# Patient Record
Sex: Female | Born: 1955 | Race: Black or African American | Hispanic: No | Smoking: Never smoker
Health system: Southern US, Community
[De-identification: ages and names within clinical notes are randomized; demographics above are authoritative.]

## PROBLEM LIST (undated history)

## (undated) DIAGNOSIS — I10 Essential (primary) hypertension: Secondary | ICD-10-CM

## (undated) DIAGNOSIS — G5600 Carpal tunnel syndrome, unspecified upper limb: Secondary | ICD-10-CM

## (undated) DIAGNOSIS — M1990 Unspecified osteoarthritis, unspecified site: Secondary | ICD-10-CM

## (undated) DIAGNOSIS — R002 Palpitations: Secondary | ICD-10-CM

## (undated) HISTORY — PX: NEPHRECTOMY: SHX65

## (undated) HISTORY — DX: Unspecified osteoarthritis, unspecified site: M19.90

## (undated) HISTORY — PX: BACK SURGERY: SHX140

## (undated) HISTORY — DX: Carpal tunnel syndrome, unspecified upper limb: G56.00

## (undated) HISTORY — PX: HERNIA REPAIR: SHX51

---

## 2004-11-19 ENCOUNTER — Emergency Department: Payer: Self-pay | Admitting: Emergency Medicine

## 2005-10-30 ENCOUNTER — Emergency Department: Payer: Self-pay | Admitting: Emergency Medicine

## 2006-11-12 ENCOUNTER — Ambulatory Visit: Payer: Self-pay | Admitting: Internal Medicine

## 2006-11-20 ENCOUNTER — Inpatient Hospital Stay: Payer: Self-pay | Admitting: Internal Medicine

## 2006-11-29 ENCOUNTER — Other Ambulatory Visit: Payer: Self-pay

## 2006-11-29 ENCOUNTER — Ambulatory Visit: Payer: Self-pay | Admitting: Urology

## 2006-12-05 ENCOUNTER — Inpatient Hospital Stay: Payer: Self-pay | Admitting: Urology

## 2006-12-07 ENCOUNTER — Other Ambulatory Visit: Payer: Self-pay

## 2006-12-12 ENCOUNTER — Ambulatory Visit: Payer: Self-pay | Admitting: Internal Medicine

## 2006-12-18 ENCOUNTER — Ambulatory Visit: Payer: Self-pay | Admitting: Internal Medicine

## 2007-01-12 ENCOUNTER — Ambulatory Visit: Payer: Self-pay | Admitting: Internal Medicine

## 2009-11-19 ENCOUNTER — Emergency Department: Payer: Self-pay | Admitting: Emergency Medicine

## 2010-02-04 ENCOUNTER — Inpatient Hospital Stay: Payer: Self-pay | Admitting: Specialist

## 2010-11-28 ENCOUNTER — Inpatient Hospital Stay: Payer: Self-pay | Admitting: Surgery

## 2010-12-01 LAB — PATHOLOGY REPORT

## 2014-10-21 ENCOUNTER — Ambulatory Visit
Admission: RE | Admit: 2014-10-21 | Discharge: 2014-10-21 | Disposition: A | Discharge status: Home / Self Care | Payer: Self-pay | Source: Ambulatory Visit | Attending: Oncology | Admitting: Oncology

## 2014-10-21 ENCOUNTER — Other Ambulatory Visit: Payer: Self-pay | Admitting: Oncology

## 2014-10-21 ENCOUNTER — Ambulatory Visit: Payer: Self-pay | Attending: Oncology | Admitting: *Deleted

## 2014-10-21 VITALS — BP 144/90 | HR 75 | Temp 98.6°F | Resp 16 | Ht 63.0 in | Wt 195.2 lb

## 2014-10-21 DIAGNOSIS — Z0000 Encounter for general adult medical examination without abnormal findings: Secondary | ICD-10-CM

## 2014-10-21 NOTE — Patient Instructions (Signed)
Gave patient hand-out, Women Staying Healthy, Active and Well from [facility], with education on breast health, pap smears, heart and colon health. 

## 2014-10-21 NOTE — Progress Notes (Signed)
Subjective:     Patient ID: Kathleen Morgan, female   DOB: 01-07-1956, 59 y.o.   MRN: [id number]  HPI   Review of Systems     Objective:   Physical Exam  Pulmonary/Chest: Right breast exhibits no inverted nipple, no mass, no nipple discharge, no skin change and no tenderness. Left breast exhibits no inverted nipple, no mass, no nipple discharge, no skin change and no tenderness. Breasts are asymmetrical.    Abdominal: There is no splenomegaly or hepatomegaly.    Genitourinary: There is no rash, tenderness, lesion or injury on the right labia. There is no rash, tenderness, lesion or injury on the left labia. No erythema, tenderness or bleeding in the vagina. No foreign body around the vagina. No signs of injury around the vagina. Vaginal discharge found.         Assessment:     59 year old Black female presents to [facility] for clinical breast exam, pap smear and mammogram.  Clinical breast exam unremarkable.  Taught self breast awareness.  Specimen collected for pap smear.  Patient has been screened for eligibility.  She does not have any insurance, Medicare or Medicaid.  She also meets financial eligibility.  Hand-out given on the Affordable Care Act.    Plan:     Screening mammogram ordered.  Will follow-up per protocol.

## 2014-10-23 LAB — PAP LB AND HPV HIGH-RISK
HPV, high-risk: NEGATIVE
PAP SMEAR COMMENT: 0

## 2014-10-26 ENCOUNTER — Encounter: Payer: Self-pay | Admitting: *Deleted

## 2014-10-26 NOTE — Progress Notes (Signed)
Patient with normal mammogram and pap smear resutls.  Letter mailed to inform her of her results.  Patient is to follow-up in one year with annual screening mammogram and in 5 years for her pap smear.  [facility] to [place].

## 2016-02-15 ENCOUNTER — Emergency Department: Payer: No Typology Code available for payment source

## 2016-02-15 ENCOUNTER — Encounter: Payer: Self-pay | Admitting: Emergency Medicine

## 2016-02-15 ENCOUNTER — Emergency Department
Admission: EM | Admit: 2016-02-15 | Discharge: 2016-02-15 | Disposition: A | Discharge status: Home / Self Care | Payer: No Typology Code available for payment source | Attending: Emergency Medicine | Admitting: Emergency Medicine

## 2016-02-15 DIAGNOSIS — Y9389 Activity, other specified: Secondary | ICD-10-CM | POA: Insufficient documentation

## 2016-02-15 DIAGNOSIS — M7592 Shoulder lesion, unspecified, left shoulder: Secondary | ICD-10-CM | POA: Insufficient documentation

## 2016-02-15 DIAGNOSIS — Y92410 Unspecified street and highway as the place of occurrence of the external cause: Secondary | ICD-10-CM | POA: Insufficient documentation

## 2016-02-15 DIAGNOSIS — M19012 Primary osteoarthritis, left shoulder: Secondary | ICD-10-CM | POA: Diagnosis not present

## 2016-02-15 DIAGNOSIS — Y999 Unspecified external cause status: Secondary | ICD-10-CM | POA: Diagnosis not present

## 2016-02-15 DIAGNOSIS — M7582 Other shoulder lesions, left shoulder: Secondary | ICD-10-CM

## 2016-02-15 DIAGNOSIS — I10 Essential (primary) hypertension: Secondary | ICD-10-CM | POA: Diagnosis not present

## 2016-02-15 DIAGNOSIS — S4992XA Unspecified injury of left shoulder and upper arm, initial encounter: Secondary | ICD-10-CM | POA: Diagnosis present

## 2016-02-15 DIAGNOSIS — M778 Other enthesopathies, not elsewhere classified: Secondary | ICD-10-CM

## 2016-02-15 HISTORY — DX: Essential (primary) hypertension: I10

## 2016-02-15 HISTORY — DX: Palpitations: R00.2

## 2016-02-15 MED ORDER — HYDROCODONE-ACETAMINOPHEN 5-325 MG PO TABS
1.0000 | ORAL_TABLET | Freq: Once | ORAL | Status: AC
  Administered 2016-02-15: 1 via ORAL
  Filled 2016-02-15: qty 1

## 2016-02-15 MED ORDER — TRAMADOL HCL 50 MG PO TABS: 50.0000 mg | ORAL_TABLET | Freq: Two times a day (BID) | ORAL | 0 refills | Status: DC
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
## 2016-02-15 NOTE — ED Notes (Signed)

## 2016-02-15 NOTE — Discharge Instructions (Signed)
Take the prescription med as needed for pain relief. Take OTC Aleve or Ibuprofen for non-drowsy pain relief.

## 2016-02-15 NOTE — ED Notes (Signed)
Patient transported to X-ray 

## 2017-04-19 ENCOUNTER — Ambulatory Visit
Admission: RE | Admit: 2017-04-19 | Discharge: 2017-04-19 | Disposition: A | Discharge status: Home / Self Care | Payer: Self-pay | Source: Ambulatory Visit | Attending: Physician Assistant | Admitting: Physician Assistant

## 2017-04-19 ENCOUNTER — Other Ambulatory Visit: Payer: Self-pay | Admitting: Physician Assistant

## 2017-04-19 DIAGNOSIS — M79641 Pain in right hand: Secondary | ICD-10-CM

## 2017-04-19 DIAGNOSIS — M19041 Primary osteoarthritis, right hand: Secondary | ICD-10-CM | POA: Insufficient documentation

## 2018-02-03 ENCOUNTER — Emergency Department
Admission: EM | Admit: 2018-02-03 | Discharge: 2018-02-03 | Disposition: A | Discharge status: Home / Self Care | Payer: Self-pay | Attending: Emergency Medicine | Admitting: Emergency Medicine

## 2018-02-03 ENCOUNTER — Emergency Department: Payer: Self-pay

## 2018-02-03 ENCOUNTER — Encounter: Payer: Self-pay | Admitting: Emergency Medicine

## 2018-02-03 DIAGNOSIS — M5030 Other cervical disc degeneration, unspecified cervical region: Secondary | ICD-10-CM | POA: Insufficient documentation

## 2018-02-03 DIAGNOSIS — I10 Essential (primary) hypertension: Secondary | ICD-10-CM | POA: Insufficient documentation

## 2018-02-03 DIAGNOSIS — M542 Cervicalgia: Secondary | ICD-10-CM | POA: Insufficient documentation

## 2018-02-03 MED ORDER — KETOROLAC TROMETHAMINE 30 MG/ML IJ SOLN
30.0000 mg | Freq: Once | INTRAMUSCULAR | Status: AC
  Administered 2018-02-03: 30 mg via INTRAMUSCULAR
  Filled 2018-02-03: qty 1

## 2018-02-03 MED ORDER — HYDROCODONE-ACETAMINOPHEN 5-325 MG PO TABS: 1.0000 | ORAL_TABLET | Freq: Four times a day (QID) | ORAL | 0 refills | Status: AC | PRN

## 2018-02-03 MED ORDER — NAPROXEN 500 MG PO TABS: 500.0000 mg | ORAL_TABLET | Freq: Two times a day (BID) | ORAL | 0 refills | Status: AC

## 2018-02-03 MED ORDER — OXYCODONE-ACETAMINOPHEN 5-325 MG PO TABS
1.0000 | ORAL_TABLET | Freq: Once | ORAL | Status: AC
  Administered 2018-02-03: 1 via ORAL
  Filled 2018-02-03: qty 1

## 2018-02-03 NOTE — Discharge Instructions (Signed)
Follow-up with your primary care provider if any continued problems.  Continue taking your muscle relaxant that you have at home.  You may also use heat or ice to your neck as needed for discomfort.  Begin taking naproxen 500 mg twice daily with food.  Also for moderate to severe pain take Norco 1 every 6 hours as needed.  This medication could cause drowsiness and increase her risk for falling.  Do not drive or operate machinery while taking this medication.

## 2018-02-03 NOTE — ED Provider Notes (Signed)
[facility] Emergency Department Provider Note  ____________________________________________   None    (approximate)  I have reviewed the triage vital signs and the nursing notes.   HISTORY  Chief Complaint Spasms and Shoulder Pain   HPI Kathleen Morgan is a 62 y.o. female presents today with complaint of cervical pain and continued muscle spasms.  Patient states that she woke 4 days ago with pain in her neck.  She denies any injury.  She went to her PCP and was given a prescription for Zanaflex which she states has not helped any.  She denies any paresthesias into her extremities.  She states that she does not know of any past injury to her neck.  Currently she rates her pain as a 10/10.   Past Medical History:  Diagnosis Date  . Heart palpitations   . Hypertension     There are no active problems to display for this patient.   Past Surgical History:  Procedure Laterality Date  . BACK SURGERY    . CESAREAN SECTION    . HERNIA REPAIR    . NEPHRECTOMY      Prior to Admission medications   Medication Sig Start Date End Date Taking? Authorizing Provider  HYDROcodone-acetaminophen (NORCO/VICODIN) 5-325 MG tablet Take 1 tablet by mouth every 6 (six) hours as needed for moderate pain. 02/03/18 02/03/19  Tommi Rumps, PA-C  naproxen (NAPROSYN) 500 MG tablet Take 1 tablet (500 mg total) by mouth 2 (two) times daily with a meal. 02/03/18   Tommi Rumps, PA-C    Allergies Patient has no known allergies.  No family history on file.  Social History Social History   Tobacco Use  . Smoking status: Never Smoker  . Smokeless tobacco: Never Used  Substance Use Topics  . Alcohol use: No    Alcohol/week: 0.0 standard drinks  . Drug use: No    Review of Systems Constitutional: No fever/chills Eyes: No visual changes. ENT: No sore throat. Cardiovascular: Denies chest pain. Respiratory: Denies shortness of breath. Musculoskeletal: Positive  for cervical pain. Skin: Negative for rash. Neurological: Negative for headaches, focal weakness or numbness. ___________________________________________   PHYSICAL EXAM:  VITAL SIGNS: ED Triage Vitals  Enc Vitals Group     BP 02/03/18 0916 127/64     Pulse Rate 02/03/18 0916 72     Resp 02/03/18 0916 20     Temp 02/03/18 0916 97.8 F (36.6 C)     Temp Source 02/03/18 0916 Oral     SpO2 02/03/18 0916 95 %     Weight 02/03/18 0913 213 lb (96.6 kg)     Height 02/03/18 0913 5\' 4"  (1.626 m)     Head Circumference --      Peak Flow --      Pain Score 02/03/18 0913 10     Pain Loc --      Pain Edu? --      Excl. in GC? --    Constitutional: Alert and oriented. Well appearing and in no acute distress. Eyes: Conjunctivae are normal. PERRL. EOMI. Head: Atraumatic. Nose: No congestion/rhinnorhea. Neck: No stridor.  No gross deformities noted of the cervical spine however there is tenderness on palpation of the C 5 and 6 area.  There is moderate tenderness also on palpation of the right trapezius muscle.  Range of motion is restricted secondary to discomfort.  Cardiovascular: Normal rate, regular rhythm. Grossly normal heart sounds.  Good peripheral circulation. Respiratory: Normal respiratory effort.  No retractions. Lungs CTAB. Musculoskeletal: Tenderness on palpation of the right trapezius muscle as noted above.  No evidence of injury or discoloration.  Good muscle strength bilaterally. Neurologic:  Normal speech and language. No gross focal neurologic deficits are appreciated.  Skin:  Skin is warm, dry and intact. No rash noted. Psychiatric: Mood and affect are normal. Speech and behavior are normal.  ____________________________________________   LABS (all labs ordered are listed, but only abnormal results are displayed)  Labs Reviewed - No data to display RADIOLOGY  ED MD interpretation:   Cervical spine x-ray is negative for any acute changes but there is degenerative disc  disease noted at C6 and C7.  Official radiology report(s): Dg Cervical Spine 2-3 Views  Result Date: 02/03/2018 CLINICAL DATA:  LEFT side neck and shoulder pain since Thursday, painful to turn head to the LEFT EXAM: CERVICAL SPINE - 2-3 VIEW COMPARISON:  None FINDINGS: Slight reversal cervical lordosis question muscle spasm. Vertebral body and disc space heights maintained. Small anterior endplate spurs at A5-W0C6-C7. No fracture, subluxation, or bone destruction. Minimal lateral cervical flexion to the RIGHT. Lung apices clear. C1-C2 alignment normal. IMPRESSION: Question muscle spasm. Degenerative disc disease changes at C6-C7. Electronically Signed   By: Ulyses SouthwardMark  Boles M.D.   On: 02/03/2018 10:40    ____________________________________________   PROCEDURES  Procedure(s) performed: None  Procedures  Critical Care performed: No  ____________________________________________   INITIAL IMPRESSION / ASSESSMENT AND PLAN / ED COURSE  As part of my medical decision making, I reviewed the following data within the electronic MEDICAL RECORD NUMBER Notes from prior ED visits and [place] Controlled Substance Database  Patient presents to the ED with complaint of cervical pain with radiation into her right trapezius muscle.  She states she woke up with a crick in her neck 4 days ago which has not improved.  She saw her PCP and was placed on Zanaflex which is not helped.  She denies any injury and there are no paresthesias into her extremities.  Patient was given Percocet and Toradol 30 mg IM while waiting for x-ray.  X-ray results were read as degenerative disc disease cervical spine.  Patient was made aware.  She is to continue taking her Zanaflex and also begin taking naproxen 500 mg twice daily and Norco as needed for moderate to severe pain.  She is to follow-up with her PCP.  She is also encouraged to use ice or heat to her neck as needed for discomfort. ____________________________________________   FINAL  CLINICAL IMPRESSION(S) / ED DIAGNOSES  Final diagnoses:  Degenerative disc disease, cervical     ED Discharge Orders         Ordered    HYDROcodone-acetaminophen (NORCO/VICODIN) 5-325 MG tablet  Every 6 hours PRN     02/03/18 1101    naproxen (NAPROSYN) 500 MG tablet  2 times daily with meals     02/03/18 1101           Note:  This document was prepared using Dragon voice recognition software and may include unintentional dictation errors.    Tommi RumpsSummers, Correne Lalani L, PA-C 02/03/18 1155    Jeanmarie PlantMcShane, James A, MD 02/03/18 1434

## 2018-02-03 NOTE — ED Triage Notes (Signed)
Pt reports is having muscle spasms in her neck and shoulder, went to her MD last week and was given zanaflex but it is not getting any better. Pt reports she woke up Thursday morning with the pain, denies injuries.

## 2018-02-03 NOTE — ED Notes (Addendum)
Pt reports left side neck and shoulder pain, states sxs started on Thursday, was seen by PCP and prescribed Zanaflex which she states is not helping. Pt states it painful to turn head to left side. Pt also taking ibuprofen which she states is not helping with sxs.    [facility].

## 2019-05-27 ENCOUNTER — Ambulatory Visit
Admission: RE | Admit: 2019-05-27 | Discharge: 2019-05-27 | Disposition: A | Discharge status: Home / Self Care | Payer: Medicare Other | Source: Ambulatory Visit | Attending: Family Medicine | Admitting: Family Medicine

## 2019-05-27 ENCOUNTER — Other Ambulatory Visit: Payer: Self-pay | Admitting: Family Medicine

## 2019-05-27 DIAGNOSIS — M25561 Pain in right knee: Secondary | ICD-10-CM

## 2019-06-10 ENCOUNTER — Emergency Department
Admission: EM | Admit: 2019-06-10 | Discharge: 2019-06-10 | Disposition: A | Discharge status: Home / Self Care | Payer: Medicare Other | Attending: Emergency Medicine | Admitting: Emergency Medicine

## 2019-06-10 ENCOUNTER — Emergency Department: Payer: Medicare Other

## 2019-06-10 ENCOUNTER — Encounter: Payer: Self-pay | Admitting: Medical Oncology

## 2019-06-10 ENCOUNTER — Other Ambulatory Visit: Payer: Self-pay

## 2019-06-10 DIAGNOSIS — J36 Peritonsillar abscess: Secondary | ICD-10-CM | POA: Diagnosis not present

## 2019-06-10 DIAGNOSIS — Z20822 Contact with and (suspected) exposure to covid-19: Secondary | ICD-10-CM | POA: Diagnosis not present

## 2019-06-10 DIAGNOSIS — J039 Acute tonsillitis, unspecified: Secondary | ICD-10-CM

## 2019-06-10 DIAGNOSIS — I10 Essential (primary) hypertension: Secondary | ICD-10-CM | POA: Insufficient documentation

## 2019-06-10 DIAGNOSIS — R112 Nausea with vomiting, unspecified: Secondary | ICD-10-CM | POA: Diagnosis present

## 2019-06-10 LAB — COMPREHENSIVE METABOLIC PANEL
ALT: 106 U/L — ABNORMAL HIGH (ref 0–44)
AST: 98 U/L — ABNORMAL HIGH (ref 15–41)
Albumin: 3.6 g/dL (ref 3.5–5.0)
Alkaline Phosphatase: 123 U/L (ref 38–126)
Anion gap: 11 (ref 5–15)
BUN: 18 mg/dL (ref 8–23)
CO2: 26 mmol/L (ref 22–32)
Calcium: 9 mg/dL (ref 8.9–10.3)
Chloride: 95 mmol/L — ABNORMAL LOW (ref 98–111)
Creatinine, Ser: 1.23 mg/dL — ABNORMAL HIGH (ref 0.44–1.00)
GFR calc Af Amer: 53 mL/min — ABNORMAL LOW (ref >60)
GFR calc non Af Amer: 46 mL/min — ABNORMAL LOW (ref >60)
Glucose, Bld: 130 mg/dL — ABNORMAL HIGH (ref 70–99)
Potassium: 3.8 mmol/L (ref 3.5–5.1)
Sodium: 132 mmol/L — ABNORMAL LOW (ref 135–145)
Total Bilirubin: 4.4 mg/dL — ABNORMAL HIGH (ref 0.3–1.2)
Total Protein: 8.8 g/dL — ABNORMAL HIGH (ref 6.5–8.1)

## 2019-06-10 LAB — URINALYSIS, COMPLETE (UACMP) WITH MICROSCOPIC: Specific Gravity, Urine: 1.032 — ABNORMAL HIGH (ref 1.005–1.030)

## 2019-06-10 LAB — CBC
HCT: 38.3 % (ref 36.0–46.0)
Hemoglobin: 12.7 g/dL (ref 12.0–15.0)
MCH: 30 pg (ref 26.0–34.0)
MCHC: 33.2 g/dL (ref 30.0–36.0)
MCV: 90.3 fL (ref 80.0–100.0)
Platelets: 254 10*3/uL (ref 150–400)
RBC: 4.24 MIL/uL (ref 3.87–5.11)
RDW: 13.5 % (ref 11.5–15.5)
WBC: 24.3 10*3/uL — ABNORMAL HIGH (ref 4.0–10.5)
nRBC: 0 % (ref 0.0–0.2)

## 2019-06-10 LAB — GROUP A STREP BY PCR: Group A Strep by PCR: DETECTED — AB

## 2019-06-10 LAB — POC SARS CORONAVIRUS 2 AG: SARS Coronavirus 2 Ag: NEGATIVE

## 2019-06-10 LAB — LIPASE, BLOOD: Lipase: 385 U/L — ABNORMAL HIGH (ref 11–51)

## 2019-06-10 MED ORDER — DEXAMETHASONE SODIUM PHOSPHATE 10 MG/ML IJ SOLN
10.0000 mg | Freq: Once | INTRAMUSCULAR | Status: AC
  Administered 2019-06-10: 10 mg via INTRAVENOUS
  Filled 2019-06-10: qty 1

## 2019-06-10 MED ORDER — PREDNISONE 10 MG PO TABS: 10.0000 mg | ORAL_TABLET | Freq: Every day | ORAL | 0 refills | Status: DC

## 2019-06-10 MED ORDER — IOHEXOL 300 MG/ML  SOLN
75.0000 mL | Freq: Once | INTRAMUSCULAR | Status: AC | PRN
  Administered 2019-06-10: 75 mL via INTRAVENOUS

## 2019-06-10 MED ORDER — SODIUM CHLORIDE 0.9 % IV SOLN
3.0000 g | Freq: Once | INTRAVENOUS | Status: AC
  Administered 2019-06-10: 3 g via INTRAVENOUS
  Filled 2019-06-10: qty 8

## 2019-06-10 MED ORDER — AMOXICILLIN-POT CLAVULANATE 875-125 MG PO TABS: 1.0000 | ORAL_TABLET | Freq: Two times a day (BID) | ORAL | 0 refills | Status: AC

## 2019-06-10 NOTE — ED Notes (Signed)
Pt reports that she started with N/V Friday night and since has been unable to keep anything down - pt reports that after vomiting x2 days her stomach began to hurt and her throat became sore - Pt has vomited x2 in 24 hours

## 2019-06-10 NOTE — Discharge Instructions (Signed)
Please take antibiotics and steroids as prescribed.  You may use over-the-counter Chloraseptic spray or lozenges for symptom relief as well.  You may also take Tylenol as needed for discomfort as written on the box.  Please follow-up with ENT in 2 days for recheck/reevaluation.  Call at the number provided.  Return to the emergency department for any increased pain, sensation of increased swelling such as difficulty swallowing or difficulty breathing, or any other symptom personally concerning to yourself.

## 2019-06-10 NOTE — ED Notes (Signed)
Pt PO challenged with water per Dr Lenard Lance request - pt was able to drink cup of cold water and even though swallowing was painful she was able to drink and stated that it "feels amazing" - Dr Lenard Lance notified

## 2019-06-10 NOTE — ED Provider Notes (Addendum)
[facility] Emergency Department Provider Note  Time seen: 10:35 AM  I have reviewed the triage vital signs and the nursing notes.   HISTORY  Chief Complaint Emesis and Abdominal Pain  HPI Kathleen Morgan is a 64 y.o. female with a past medical history of hypertension, presents to the emergency department for sore throat nausea and upper abdominal discomfort.  According to the patient she states she slept with a fan on Friday night, since that time she has had a very sore throat to the point where she is having difficulty swallowing due to the pain.  States it hurts mostly on the left side.  Patient also states nausea but denies any vomiting.  States mild upper abdominal/epigastric pain.  No fever.  No cough shortness of breath.  No diarrhea or constipation.   Past Medical History:  Diagnosis Date  . Heart palpitations   . Hypertension     There are no problems to display for this patient.   Past Surgical History:  Procedure Laterality Date  . BACK SURGERY    . CESAREAN SECTION    . HERNIA REPAIR    . NEPHRECTOMY      Prior to Admission medications   Medication Sig Start Date End Date Taking? Authorizing Provider  naproxen (NAPROSYN) 500 MG tablet Take 1 tablet (500 mg total) by mouth 2 (two) times daily with a meal. 02/03/18   Bridget Hartshorn L, PA-C    No Known Allergies  No family history on file.  Social History Social History   Tobacco Use  . Smoking status: Never Smoker  . Smokeless tobacco: Never Used  Substance Use Topics  . Alcohol use: No    Alcohol/week: 0.0 standard drinks  . Drug use: No    Review of Systems Constitutional: Negative for fever ENT: Positive for sore throat left greater than right. Cardiovascular: Negative for chest pain. Respiratory: Negative for shortness of breath. Gastrointestinal: Mild epigastric pain.  Positive nausea.  Negative for vomiting diarrhea. Genitourinary: Negative for urinary  compaints Musculoskeletal: Negative for musculoskeletal complaints Neurological: Negative for headache All other ROS negative  ____________________________________________   PHYSICAL EXAM:  VITAL SIGNS: ED Triage Vitals  Enc Vitals Group     BP 06/10/19 1011 (!) 141/81     Pulse Rate 06/10/19 1011 97     Resp 06/10/19 1011 18     Temp 06/10/19 1011 99 F (37.2 C)     Temp Source 06/10/19 1011 Oral     SpO2 06/10/19 1011 97 %     Weight 06/10/19 1012 211 lb 10.3 oz (96 kg)     Height 06/10/19 1012 5\' 4"  (1.626 m)     Head Circumference --      Peak Flow --      Pain Score 06/10/19 1012 10     Pain Loc --      Pain Edu? --      Excl. in GC? --    Constitutional: Alert and oriented. Well appearing and in no distress. Eyes: Normal exam ENT      Head: Normocephalic and atraumatic.      Mouth/Throat: Mucous membranes are moist.  No obvious pharyngeal erythema although cannot see the entire pharynx due to patient cooperation.  Moderate left-sided anterior cervical lymphadenopathy with tenderness to this area. Cardiovascular: Normal rate, regular rhythm.  Respiratory: Normal respiratory effort without tachypnea nor retractions. Breath sounds are clear Gastrointestinal: Soft and nontender. No distention.  Musculoskeletal: Nontender with normal range of  motion in all extremities.  Neurologic:  Normal speech and language. No gross focal neurologic deficits Skin:  Skin is warm, dry and intact.  Psychiatric: Mood and affect are normal.  ____________________________________________   RADIOLOGY  CT scan shows tonsillitis with a 16 x 23 x 14 mm PTA.  ____________________________________________   INITIAL IMPRESSION / ASSESSMENT AND PLAN / ED COURSE  Pertinent labs & imaging results that were available during my care of the patient were reviewed by me and considered in my medical decision making (see chart for details).   Patient presents emergency department for a sore throat  along with nausea, pain with swallowing and epigastric discomfort.  I tried multiple times to see the patient's tonsils/posterior pharynx, patient is unwilling to allow any evaluation with the tongue depressor.  Attempted tongue depressor at the anterior tongue the patient began gagging.  Insists that she cannot do it with a tongue depressor.  Attempted multiple times without the tongue depressor however I am not able to visualize the tonsils.  Patient does have moderate left-sided anterior cervical lymphadenopathy.  We will attempt to obtain a strep swab as well as a rapid Covid swab.  We will check basic labs.  Patient has slight epigastric tenderness but otherwise a benign abdomen.  Given the patient's significant left sore throat with left-sided cervical lymphadenopathy we will likely require CT imaging to rule out PTA given our inability to see the patient's tonsils/posterior pharynx.  16 x 23 x 14 left-sided PTA.  Spoke to Dr. Kathyrn Sheriff, who recommends starting the patient on IV Unasyn and IV Decadron.  States as long as the patient is able to swallow she can be discharged home on Augmentin and a steroid taper and he will follow her up in the office.  I discussed this with the patient who is agreeable to this plan of care.  Discussed return precautions for any trouble breathing at any point, or inability to swallow secretions, etc.  Patient's lab work does show significant leukocytosis of 24,000.  Strep swab is positive.  Covid test is negative.  Patient is able to drink water without issues.  States she is feeling better.  We will discharge on prednisone, Augmentin and have the patient follow-up with ENT in several days for recheck/reevaluation.  Discussed return precautions.  Kathleen Morgan was evaluated in Emergency Department on 06/10/2019 for the symptoms described in the history of present illness. She was evaluated in the context of the global COVID-19 pandemic, which necessitated consideration that  the patient might be at risk for infection with the SARS-CoV-2 virus that causes COVID-19. Institutional protocols and algorithms that pertain to the evaluation of patients at risk for COVID-19 are in a state of rapid change based on information released by regulatory bodies including the CDC and federal and state organizations. These policies and algorithms were followed during the patient's care in the ED.  ____________________________________________   FINAL CLINICAL IMPRESSION(S) / ED DIAGNOSES  Tonsillitis Peritonsillar abscess   Harvest Dark, MD 06/10/19 1236    Harvest Dark, MD 06/10/19 1414

## 2019-06-10 NOTE — ED Triage Notes (Signed)
Pt reports NV and sore throat that began Saturday. Pt denies fever. Reports generalized abd pain too.

## 2019-06-21 ENCOUNTER — Other Ambulatory Visit: Payer: Self-pay

## 2019-06-21 ENCOUNTER — Encounter: Payer: Self-pay | Admitting: Emergency Medicine

## 2019-06-21 ENCOUNTER — Emergency Department
Admission: EM | Admit: 2019-06-21 | Discharge: 2019-06-21 | Disposition: A | Discharge status: Home / Self Care | Payer: Medicare Other | Attending: Emergency Medicine | Admitting: Emergency Medicine

## 2019-06-21 DIAGNOSIS — H10023 Other mucopurulent conjunctivitis, bilateral: Secondary | ICD-10-CM | POA: Diagnosis not present

## 2019-06-21 DIAGNOSIS — H5713 Ocular pain, bilateral: Secondary | ICD-10-CM | POA: Diagnosis present

## 2019-06-21 MED ORDER — FLUORESCEIN SODIUM 1 MG OP STRP
1.0000 | ORAL_STRIP | Freq: Once | OPHTHALMIC | Status: AC
  Administered 2019-06-21: 16:00:00 1 via OPHTHALMIC
  Filled 2019-06-21: qty 1

## 2019-06-21 MED ORDER — EYE WASH OPHTH SOLN
1.0000 [drp] | OPHTHALMIC | Status: DC | PRN
  Filled 2019-06-21 (×2): qty 118

## 2019-06-21 MED ORDER — SULFACETAMIDE SODIUM 10 % OP SOLN: 2.0000 [drp] | Freq: Four times a day (QID) | OPHTHALMIC | 0 refills | Status: AC

## 2019-06-21 MED ORDER — OLOPATADINE HCL 0.1 % OP SOLN: 1.0000 [drp] | Freq: Two times a day (BID) | OPHTHALMIC | 1 refills | Status: AC

## 2019-06-21 MED ORDER — TETRACAINE HCL 0.5 % OP SOLN
2.0000 [drp] | Freq: Once | OPHTHALMIC | Status: AC
  Administered 2019-06-21: 16:00:00 2 [drp] via OPHTHALMIC
  Filled 2019-06-21: qty 4

## 2019-06-21 NOTE — ED Triage Notes (Signed)
FIRST NURSE NOTE:  Reports redness to left eye since Tuesday, worse today, reports feeling like sand in eye and gritty feeling in eye.

## 2019-06-21 NOTE — ED Provider Notes (Signed)
[facility] Emergency Department Provider Note   ____________________________________________   First MD Initiated Contact with Patient 06/21/19 1445     (approximate)  I have reviewed the triage vital signs and the nursing notes.   HISTORY  Chief Complaint Eye Pain    HPI Kathleen Morgan is a 64 y.o. female patient presents with bilateral eye pain and redness, left greater than right.  Patient states eyes feel "gritty".  Patient denies provocative incident for complaint.  Patient states several years since her last eye exam.  Patient do not use corrective lenses except for over-the-counter reading glasses.         Past Medical History:  Diagnosis Date  . Heart palpitations   . Hypertension     There are no problems to display for this patient.   Past Surgical History:  Procedure Laterality Date  . BACK SURGERY    . CESAREAN SECTION    . HERNIA REPAIR    . NEPHRECTOMY      Prior to Admission medications   Medication Sig Start Date End Date Taking? Authorizing Provider  naproxen (NAPROSYN) 500 MG tablet Take 1 tablet (500 mg total) by mouth 2 (two) times daily with a meal. 02/03/18   Letitia Neri L, PA-C  olopatadine (PATANOL) 0.1 % ophthalmic solution Place 1 drop into both eyes 2 (two) times daily. 06/21/19 06/20/20  Sable Feil, PA-C  predniSONE (DELTASONE) 10 MG tablet Take 1 tablet (10 mg total) by mouth daily. Day 1-3: take 4 tablets PO daily Day 4-6: take 3 tablets PO daily Day 7-9: take 2 tablets PO daily Day 10-12: take 1 tablet PO daily 06/10/19   Harvest Dark, MD  sulfacetamide (BLEPH-10) 10 % ophthalmic solution Place 2 drops into both eyes 4 (four) times daily for 10 days. 06/21/19 07/01/19  Sable Feil, PA-C    Allergies Patient has no known allergies.  History reviewed. No pertinent family history.  Social History Social History   Tobacco Use  . Smoking status: Never Smoker  . Smokeless tobacco: Never  Used  Substance Use Topics  . Alcohol use: No    Alcohol/week: 0.0 standard drinks  . Drug use: No    Review of Systems Constitutional: No fever/chills Eyes: Bilateral eye pain and drainage. ENT: No sore throat. Cardiovascular: Denies chest pain. Respiratory: Denies shortness of breath. Gastrointestinal: No abdominal pain.  No nausea, no vomiting.  No diarrhea.  No constipation. Genitourinary: Negative for dysuria. Musculoskeletal: Negative for back pain. Skin: Negative for rash. Neurological: Negative for headaches, focal weakness or numbness. Endocrine:  Hypertension   ____________________________________________   PHYSICAL EXAM:  VITAL SIGNS: ED Triage Vitals  Enc Vitals Group     BP 06/21/19 1413 (!) 141/83     Pulse Rate 06/21/19 1413 98     Resp 06/21/19 1413 20     Temp 06/21/19 1413 98 F (36.7 C)     Temp Source 06/21/19 1413 Oral     SpO2 06/21/19 1413 95 %     Weight 06/21/19 1410 218 lb (98.9 kg)     Height 06/21/19 1410 5\' 4"  (1.626 m)     Head Circumference --      Peak Flow --      Pain Score 06/21/19 1410 8     Pain Loc --      Pain Edu? --      Excl. in [place]? --    Constitutional: Alert and oriented. Well appearing and in no  acute distress. Eyes: See nurses note visual acuity.  Conjunctivae are erythematous.  PERRL. EOMI. dry yellow-green discharge left eye.   Cardiovascular: Normal rate, regular rhythm. Grossly normal heart sounds.  Good peripheral circulation. Respiratory: Normal respiratory effort.  No retractions. Lungs CTAB. Skin:  Skin is warm, dry and intact. No rash noted. Psychiatric: Mood and affect are normal. Speech and behavior are normal.  ____________________________________________   LABS (all labs ordered are listed, but only abnormal results are displayed)  Labs Reviewed - No data to display ____________________________________________  EKG   ____________________________________________  RADIOLOGY  ED MD  interpretation:    Official radiology report(s): No results found.  ____________________________________________   PROCEDURES  Procedure(s) performed (including Critical Care):  Procedures   ____________________________________________   INITIAL IMPRESSION / ASSESSMENT AND PLAN / ED COURSE  As part of my medical decision making, I reviewed the following data within the electronic MEDICAL RECORD NUMBER     Patient presents with 4 days of back pain with a yellow-greenish discharge.  Physical exam is consistent with conjunctivitis.  Patient given discharge care instruction advised use eyedrops as directed.  Patient also advised to follow-up with optometry.    Kathleen Morgan was evaluated in Emergency Department on 06/21/2019 for the symptoms described in the history of present illness. She was evaluated in the context of the global COVID-19 pandemic, which necessitated consideration that the patient might be at risk for infection with the SARS-CoV-2 virus that causes COVID-19. Institutional protocols and algorithms that pertain to the evaluation of patients at risk for COVID-19 are in a state of rapid change based on information released by regulatory bodies including the CDC and federal and state organizations. These policies and algorithms were followed during the patient's care in the ED.       ____________________________________________   FINAL CLINICAL IMPRESSION(S) / ED DIAGNOSES  Final diagnoses:  Other mucopurulent conjunctivitis of both eyes     ED Discharge Orders         Ordered    sulfacetamide (BLEPH-10) 10 % ophthalmic solution  4 times daily     06/21/19 1538    olopatadine (PATANOL) 0.1 % ophthalmic solution  2 times daily     06/21/19 1538           Note:  This document was prepared using Dragon voice recognition software and may include unintentional dictation errors.    Joni Reining, PA-C 06/21/19 1542    Sharyn Creamer, MD 06/21/19 [phone]

## 2019-06-21 NOTE — ED Triage Notes (Signed)
See first Rn Note, pt reports symptoms since Tuesday. Redness noted to L eye upon arrival to triage.

## 2019-06-21 NOTE — Discharge Instructions (Signed)
Follow discharge care instruction use eyedrops as directed.  Advised to follow-up with ophthalmology for definitive eye exam.

## 2019-08-15 ENCOUNTER — Other Ambulatory Visit: Payer: Self-pay | Admitting: Physician Assistant

## 2019-08-15 DIAGNOSIS — Z13820 Encounter for screening for osteoporosis: Secondary | ICD-10-CM

## 2019-08-15 DIAGNOSIS — Z1231 Encounter for screening mammogram for malignant neoplasm of breast: Secondary | ICD-10-CM

## 2020-12-24 IMAGING — CR DG KNEE COMPLETE 4+V*R*
1 series · 4 of 4 positions shown · non-contrast
Comparison: None.

CLINICAL DATA: Acute right knee pain.

EXAM:
RIGHT KNEE - COMPLETE 4+ VIEW

[Series 1: dg knee complete 4 views right · 0.14mm/px · 4 of 4 slices shown]
[im 1/4]
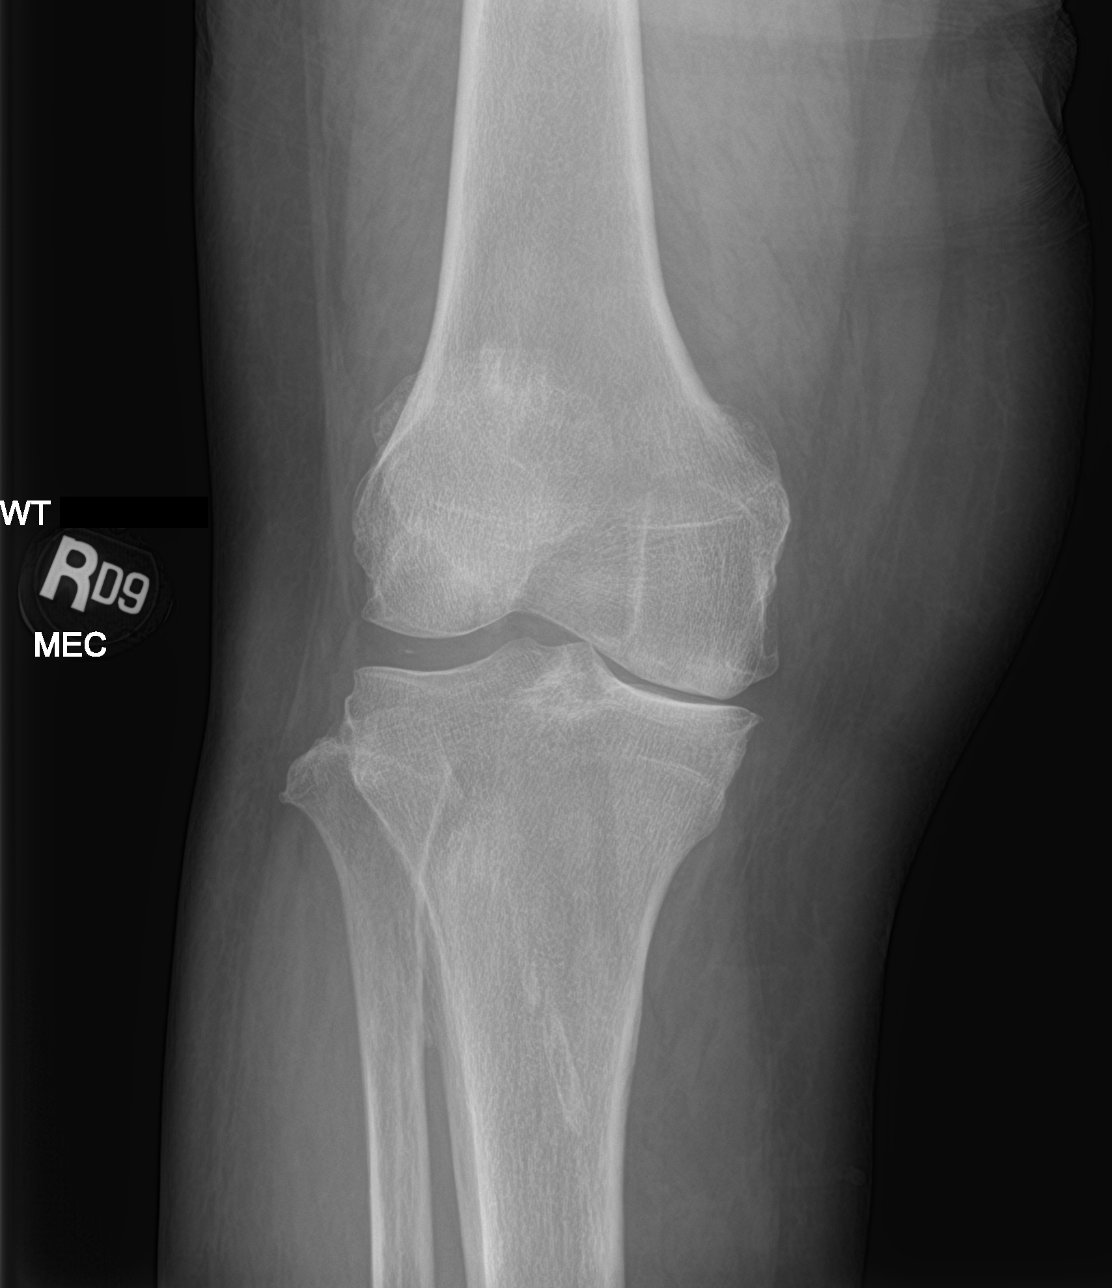
[im 2/4]
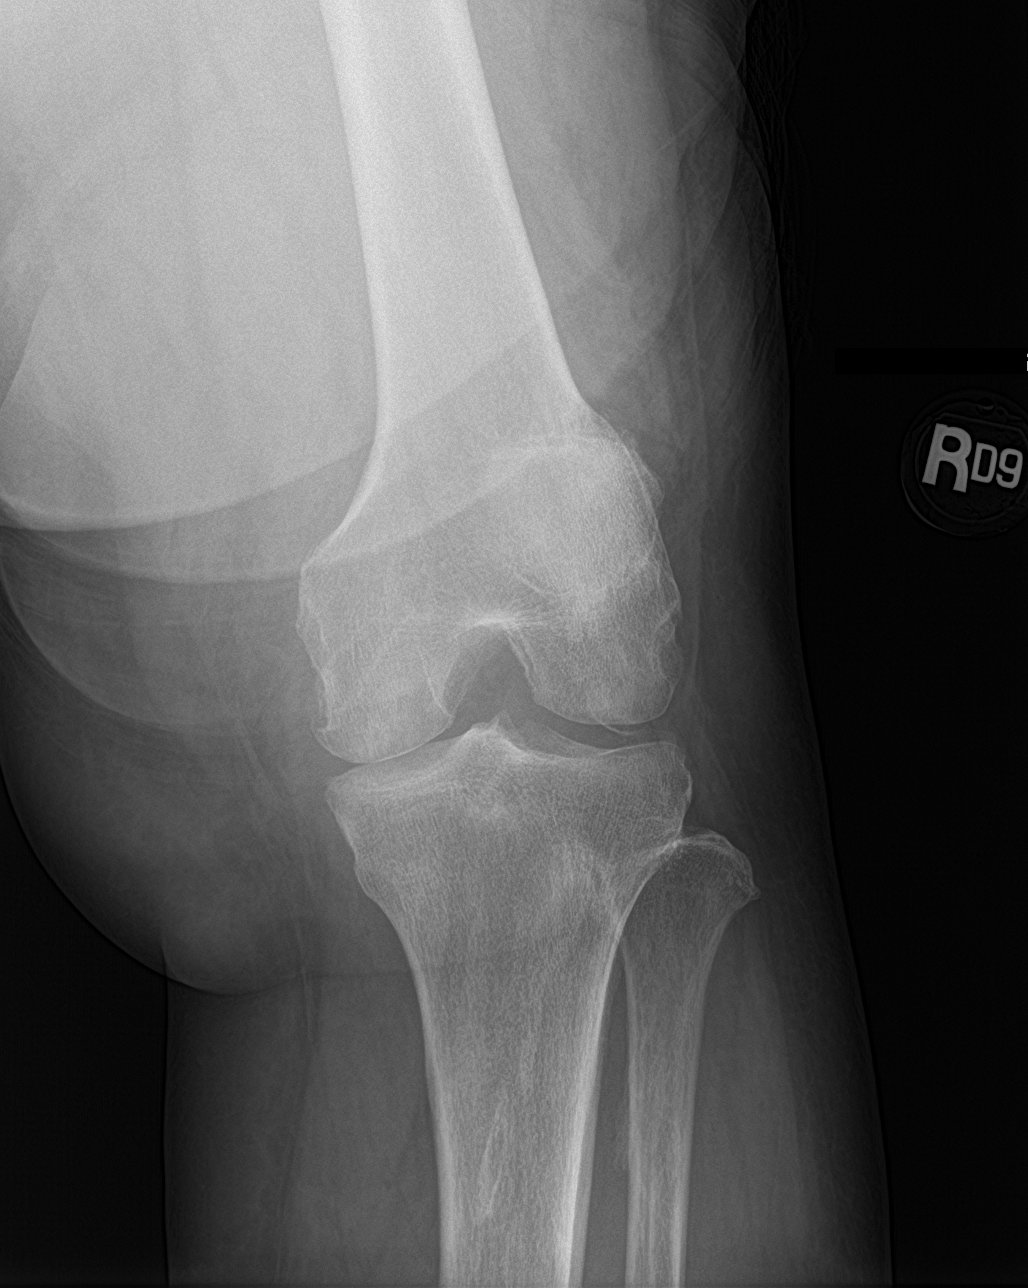
[im 3/4]
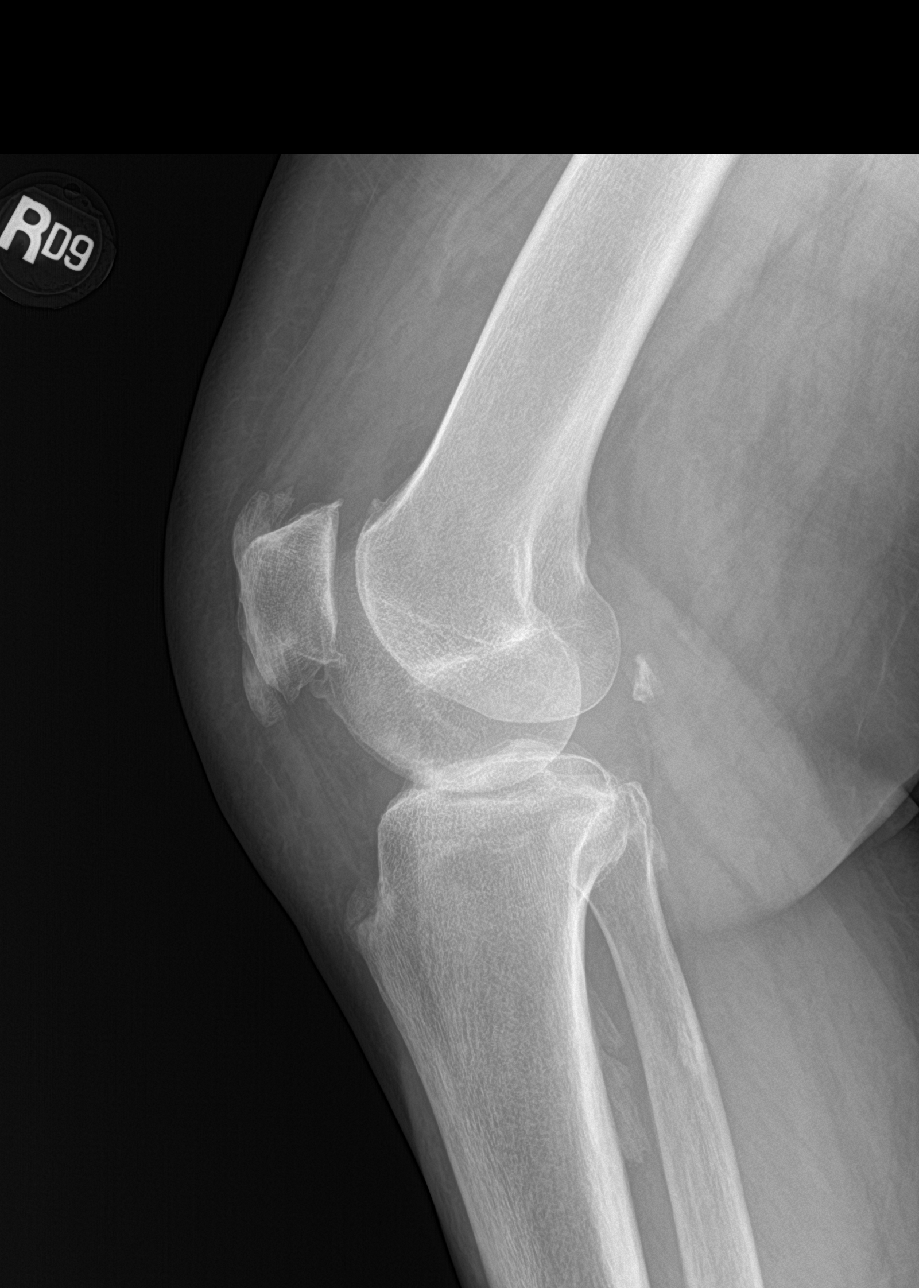
[im 4/4]
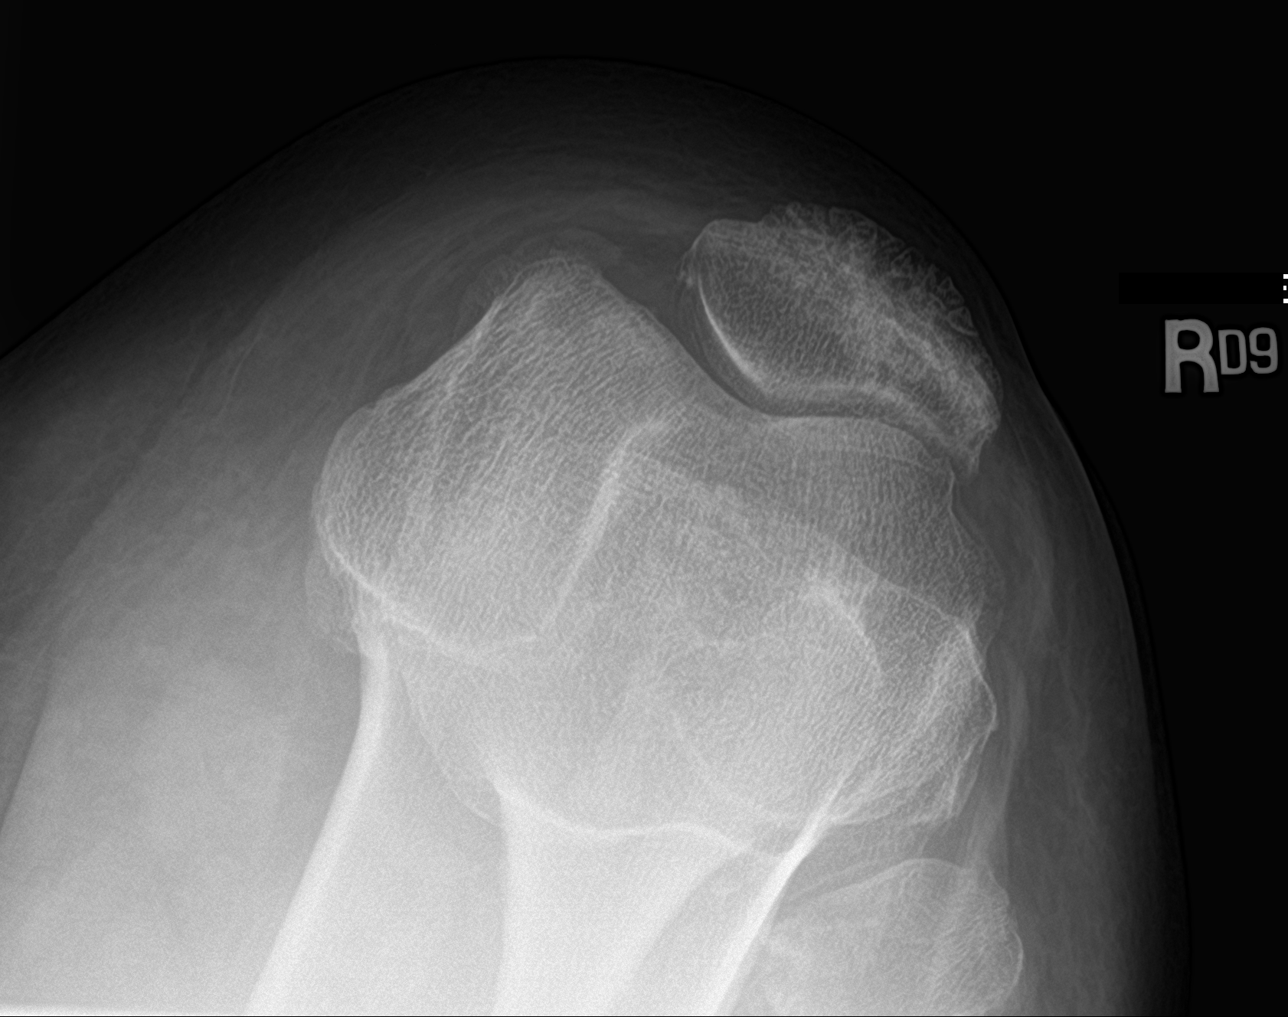

[4 of 4 positions shown; findings below may reference images not displayed]

FINDINGS: No acute fracture or dislocation. Three compartment osteoarthritis,
moderate to severe in the medial and moderate in the patellofemoral
compartments. Enthesophyte at the quadriceps insertion and patellar
tendon origin. Possible small suprapatellar joint effusion.
IMPRESSION: Degenerative change, without acute osseous finding.

Possible small suprapatellar joint effusion.

## 2022-05-26 ENCOUNTER — Other Ambulatory Visit: Payer: Self-pay | Admitting: Physician Assistant

## 2022-05-26 DIAGNOSIS — Z1231 Encounter for screening mammogram for malignant neoplasm of breast: Secondary | ICD-10-CM

## 2022-07-19 ENCOUNTER — Encounter: Payer: Self-pay | Admitting: Cardiology

## 2022-07-19 ENCOUNTER — Ambulatory Visit: Payer: Medicare HMO | Attending: Cardiology | Admitting: Cardiology

## 2022-07-19 ENCOUNTER — Ambulatory Visit: Payer: Medicare Other | Admitting: Cardiology

## 2022-07-19 VITALS — BP 136/80 | HR 66 | Ht 64.0 in | Wt 225.6 lb

## 2022-07-19 DIAGNOSIS — Z6838 Body mass index (BMI) 38.0-38.9, adult: Secondary | ICD-10-CM | POA: Diagnosis not present

## 2022-07-19 DIAGNOSIS — R011 Cardiac murmur, unspecified: Secondary | ICD-10-CM | POA: Diagnosis not present

## 2022-07-19 DIAGNOSIS — I10 Essential (primary) hypertension: Secondary | ICD-10-CM | POA: Diagnosis not present

## 2022-07-19 DIAGNOSIS — R002 Palpitations: Secondary | ICD-10-CM

## 2022-07-19 NOTE — Patient Instructions (Signed)
Medication Instructions:   1., Your physician recommends that you continue on your current medications as directed. Please refer to the Current Medication list given to you today.  *If you need a refill on your cardiac medications before your next appointment, please call your pharmacy*   Lab Work:  None ordered  If you have labs (blood work) drawn today and your tests are completely normal, you will receive your results only by: MyChart Message (if you have MyChart) OR A paper copy in the mail If you have any lab test that is abnormal or we need to change your treatment, we will call you to review the results.   Testing/Procedures:  Your physician has requested that you have an echocardiogram. Echocardiography is a painless test that uses sound waves to create images of your heart. It provides your doctor with information about the size and shape of your heart and how well your heart's chambers and valves are working. This procedure takes approximately one hour. There are no restrictions for this procedure. Please do NOT wear cologne, perfume, aftershave, or lotions (deodorant is allowed). Please arrive 15 minutes prior to your appointment time.    Follow-Up: At [facility], you and your health needs are our priority.  As part of our continuing mission to provide you with exceptional heart care, we have created designated Provider Care Teams.  These Care Teams include your primary Cardiologist (physician) and Advanced Practice Providers (APPs -  Physician Assistants and Nurse Practitioners) who all work together to provide you with the care you need, when you need it.  We recommend signing up for the patient portal called "MyChart".  Sign up information is provided on this After Visit Summary.  MyChart is used to connect with patients for Virtual Visits (Telemedicine).  Patients are able to view lab/test results, encounter notes, upcoming appointments, etc.  Non-urgent messages  can be sent to your provider as well.   To learn more about what you can do with MyChart, go to [email or web address].    Your next appointment:    After testing   Provider:   You may see Debbe Odea, MD or one of the following Advanced Practice Providers on your designated Care Team:   Nicolasa Ducking, NP Eula Listen, PA-C Cadence Fransico Michael, PA-C Charlsie Quest, NP

## 2022-07-19 NOTE — Progress Notes (Signed)
Cardiology Office Note:    Date:  07/19/2022   ID:  Kathleen Morgan, DOB [birth year], MRN [id number]  PCP:  [facility]   [facility] Providers Cardiologist:  Debbe Odea, MD     Referring MD: [facility]   Chief Complaint  Patient presents with   New Patient (Initial Visit)    Being referred for heart murmur with no cardiac hx.  Does have episodes of heart palpitations.      History of Present Illness:    Kathleen Morgan is a 67 y.o. female with a hx of hypertension who presented due to cardiac murmur and palpitations.  Patient saw primary care physician for scheduled visit last month, cardiac murmur noted on exam.  She denies any history of heart disease.  Has been on diltiazem for BP control for multiple years now.  She states having occasional palpitations lasting a few seconds, occurring less than once a month.  Denies any dizziness, presyncope or syncope, denies history of stroke.  Endorses eating high carb diet, working on cutting back on sweets.  Past Medical History:  Diagnosis Date   Arthritis    Carpal tunnel syndrome    Heart palpitations    Hypertension     Past Surgical History:  Procedure Laterality Date   BACK SURGERY     CESAREAN SECTION     HERNIA REPAIR     NEPHRECTOMY      Current Medications: Current Meds  Medication Sig   DILT-XR 240 MG 24 hr capsule Take 480 mg by mouth daily. Take 2 capsules QD   famotidine (PEPCID) 20 MG tablet Take 20 mg by mouth daily as needed for heartburn.   meloxicam (MOBIC) 15 MG tablet Take 15 mg by mouth daily as needed for pain.   PATADAY 0.7 % SOLN Apply 1 drop to eye daily as needed (allergies).     Allergies:   Patient has no known allergies.   Social History   Socioeconomic History   Marital status: Divorced    Spouse name: Not on file   Number of children: Not on file   Years of education: Not on file   Highest education level: Not on file  Occupational  History   Not on file  Tobacco Use   Smoking status: Never   Smokeless tobacco: Never  Vaping Use   Vaping Use: Never used  Substance and Sexual Activity   Alcohol use: No    Alcohol/week: 0.0 standard drinks of alcohol   Drug use: No   Sexual activity: Not on file  Other Topics Concern   Not on file  Social History Narrative   Not on file   Social Determinants of Health   Financial Resource Strain: Not on file  Food Insecurity: Not on file  Transportation Needs: Not on file  Physical Activity: Not on file  Stress: Not on file  Social Connections: Not on file     Family History: The patient's family history includes Congestive Heart Failure in her daughter and mother.  ROS:   Please see the history of present illness.     All other systems reviewed and are negative.  EKGs/Labs/Other Studies Reviewed:    The following studies were reviewed today:   EKG:  EKG is  ordered today.  The ekg ordered today demonstrates normal sinus rhythm, heart rate 66  Recent Labs: No results found for requested labs within last 365 days.  Recent Lipid Panel No results  found for: "CHOL", "TRIG", "HDL", "CHOLHDL", "VLDL", "LDLCALC", "LDLDIRECT"   Risk Assessment/Calculations:             Physical Exam:    VS:  BP 136/80 (BP Location: Right Arm, Patient Position: Sitting, Cuff Size: Large)   Pulse 66   Ht 5\' 4"  (1.626 m)   Wt 225 lb 9.6 oz (102.3 kg)   SpO2 97%   BMI 38.72 kg/m     Wt Readings from Last 3 Encounters:  07/19/22 225 lb 9.6 oz (102.3 kg)  06/21/19 218 lb (98.9 kg)  06/10/19 211 lb 10.3 oz (96 kg)     GEN:  Well nourished, well developed in no acute distress HEENT: Normal NECK: No JVD; No carotid bruits CARDIAC: RRR, 2/6 systolic murmur RESPIRATORY:  Clear to auscultation without rales, wheezing or rhonchi  ABDOMEN: Soft, non-tender, non-distended MUSCULOSKELETAL:  No edema; No deformity  SKIN: Warm and dry NEUROLOGIC:  Alert and oriented x  3 PSYCHIATRIC:  Normal affect   ASSESSMENT:    1. Palpitations   2. Cardiac murmur   3. Primary hypertension   4. BMI 38.0-38.9,adult    PLAN:    In order of problems listed above:  Palpitations, very infrequent, occurring less than once a month.  Continue to monitor for now. Systolic murmur on exam, obtain echocardiogram to evaluate any significant structural abnormalities. Hypertension, BP controlled.  Continue diltiazem. Obesity, low-calorie diet advised.  Follow-up after echocardiogram     Medication Adjustments/Labs and Tests Ordered: Current medicines are reviewed at length with the patient today.  Concerns regarding medicines are outlined above.  Orders Placed This Encounter  Procedures   EKG 12-Lead   ECHOCARDIOGRAM COMPLETE   No orders of the defined types were placed in this encounter.   Patient Instructions  Medication Instructions:   1., Your physician recommends that you continue on your current medications as directed. Please refer to the Current Medication list given to you today.  *If you need a refill on your cardiac medications before your next appointment, please call your pharmacy*   Lab Work:  None ordered  If you have labs (blood work) drawn today and your tests are completely normal, you will receive your results only by: MyChart Message (if you have MyChart) OR A paper copy in the mail If you have any lab test that is abnormal or we need to change your treatment, we will call you to review the results.   Testing/Procedures:  Your physician has requested that you have an echocardiogram. Echocardiography is a painless test that uses sound waves to create images of your heart. It provides your doctor with information about the size and shape of your heart and how well your heart's chambers and valves are working. This procedure takes approximately one hour. There are no restrictions for this procedure. Please do NOT wear cologne, perfume,  aftershave, or lotions (deodorant is allowed). Please arrive 15 minutes prior to your appointment time.    Follow-Up: At [facility], you and your health needs are our priority.  As part of our continuing mission to provide you with exceptional heart care, we have created designated Provider Care Teams.  These Care Teams include your primary Cardiologist (physician) and Advanced Practice Providers (APPs -  Physician Assistants and Nurse Practitioners) who all work together to provide you with the care you need, when you need it.  We recommend signing up for the patient portal called "MyChart".  Sign up information is provided on this After  Visit Summary.  MyChart is used to connect with patients for Virtual Visits (Telemedicine).  Patients are able to view lab/test results, encounter notes, upcoming appointments, etc.  Non-urgent messages can be sent to your provider as well.   To learn more about what you can do with MyChart, go to [email or web address].    Your next appointment:    After testing   Provider:   You may see Debbe Odea, MD or one of the following Advanced Practice Providers on your designated Care Team:   Nicolasa Ducking, NP Eula Listen, PA-C Cadence Fransico Michael, PA-C Charlsie Quest, NP   Signed, Debbe Odea, MD  07/19/2022 2:23 PM    [facility]

## 2022-08-09 ENCOUNTER — Ambulatory Visit
Admission: RE | Admit: 2022-08-09 | Discharge: 2022-08-09 | Disposition: A | Discharge status: Home / Self Care | Payer: Medicare HMO | Source: Ambulatory Visit | Attending: Physician Assistant | Admitting: Physician Assistant

## 2022-08-09 DIAGNOSIS — Z1231 Encounter for screening mammogram for malignant neoplasm of breast: Secondary | ICD-10-CM | POA: Diagnosis not present

## 2022-08-16 ENCOUNTER — Ambulatory Visit: Payer: Medicare HMO | Attending: Cardiology

## 2022-08-16 DIAGNOSIS — R011 Cardiac murmur, unspecified: Secondary | ICD-10-CM | POA: Diagnosis not present

## 2022-08-16 DIAGNOSIS — R002 Palpitations: Secondary | ICD-10-CM

## 2022-08-16 LAB — ECHOCARDIOGRAM COMPLETE
AR max vel: 2.59 cm2
AV Area VTI: 2.63 cm2
AV Area mean vel: 2.56 cm2
AV Mean grad: 6 mmHg
AV Peak grad: 11.7 mmHg
Ao pk vel: 1.71 m/s
Area-P 1/2: 3.48 cm2
Calc EF: 64.3 %
S' Lateral: 3.3 cm
Single Plane A2C EF: 64.5 %
Single Plane A4C EF: 64 %

## 2022-08-28 ENCOUNTER — Encounter: Payer: Self-pay | Admitting: Cardiology

## 2022-08-28 ENCOUNTER — Ambulatory Visit: Payer: Medicare HMO | Attending: Cardiology | Admitting: Cardiology

## 2022-08-28 VITALS — BP 126/78 | HR 72 | Ht 64.0 in | Wt 226.0 lb

## 2022-08-28 DIAGNOSIS — I77810 Thoracic aortic ectasia: Secondary | ICD-10-CM | POA: Diagnosis not present

## 2022-08-28 DIAGNOSIS — Z6838 Body mass index (BMI) 38.0-38.9, adult: Secondary | ICD-10-CM

## 2022-08-28 DIAGNOSIS — I10 Essential (primary) hypertension: Secondary | ICD-10-CM

## 2022-08-28 NOTE — Progress Notes (Signed)
Cardiology Office Note:    Date:  08/28/2022   ID:  Kathleen Morgan, DOB [birth year], MRN [id number]  PCP:  Kathleen Morgan, Kathleen Morgan [facility]   [facility] Providers Cardiologist:  Debbe Odea, MD     Referring MD: Kathleen Morgan, [facility]   Chief Complaint  Patient presents with   Follow-up    Discuss echo results.  Patient denies new or acute cardiac problems/concerns today.      History of Present Illness:    Kathleen Morgan is a 67 y.o. female with a hx of hypertension who presents for follow-up.  Previously seen due to cardiac murmur, echocardiogram was obtained to evaluate any significant structural abnormalities.  Echo showed normal EF, mild aortic valve sclerosis, moderate ascending aorta dilatation measuring 4.8 cm.  She denies chest pain, syncope.  Compliant with medications as prescribed.  No new concerns at this time.  Prior notes/studies Echo 08/2022 - EF 60 to 65%,  mild aortic sclerosis, moderate ascending aorta dilatation 4.8 cm in diameter.   Past Medical History:  Diagnosis Date   Arthritis    Carpal tunnel syndrome    Heart palpitations    Hypertension     Past Surgical History:  Procedure Laterality Date   BACK SURGERY     CESAREAN SECTION     HERNIA REPAIR     NEPHRECTOMY      Current Medications: Current Meds  Medication Sig   DILT-XR 240 MG 24 hr capsule Take 480 mg by mouth daily. Take 2 capsules QD   famotidine (PEPCID) 20 MG tablet Take 20 mg by mouth daily as needed for heartburn.   meloxicam (MOBIC) 15 MG tablet Take 15 mg by mouth daily as needed for pain.   PATADAY 0.7 % SOLN Apply 1 drop to eye daily as needed (allergies).     Allergies:   Patient has no known allergies.   Social History   Socioeconomic History   Marital status: Divorced    Spouse name: Not on file   Number of children: Not on file   Years of education: Not on file   Highest education level: Not on file  Occupational History   Not on file   Tobacco Use   Smoking status: Never   Smokeless tobacco: Never  Vaping Use   Vaping Use: Never used  Substance and Sexual Activity   Alcohol use: No    Alcohol/week: 0.0 standard drinks of alcohol   Drug use: No   Sexual activity: Not on file  Other Topics Concern   Not on file  Social History Narrative   Not on file   Social Determinants of Health   Financial Resource Strain: Not on file  Food Insecurity: Not on file  Transportation Needs: Not on file  Physical Activity: Not on file  Stress: Not on file  Social Connections: Not on file     Family History: The patient's family history includes Congestive Heart Failure in her daughter and mother. There is no history of Breast cancer.  ROS:   Please see the history of present illness.     All other systems reviewed and are negative.  EKGs/Labs/Other Studies Reviewed:    The following studies were reviewed today:   EKG:  EKG not ordered today.  The ekg ordered today demonstrates normal sinus rhythm, heart rate 66  Recent Labs: No results found for requested labs within last 365 days.  Recent Lipid Panel No results found for: "CHOL", "TRIG", "HDL", "CHOLHDL", "  VLDL", "LDLCALC", "LDLDIRECT"   Risk Assessment/Calculations:             Physical Exam:    VS:  BP 126/78 (BP Location: Left Arm, Patient Position: Sitting, Cuff Size: Large)   Pulse 72   Ht 5\' 4"  (1.626 m)   Wt 226 lb (102.5 kg)   SpO2 98%   BMI 38.79 kg/m     Wt Readings from Last 3 Encounters:  08/28/22 226 lb (102.5 kg)  07/19/22 225 lb 9.6 oz (102.3 kg)  06/21/19 218 lb (98.9 kg)     GEN:  Well nourished, well developed in no acute distress HEENT: Normal NECK: No JVD; No carotid bruits CARDIAC: RRR, 2/6 systolic murmur RESPIRATORY:  Clear to auscultation without rales, wheezing or rhonchi  ABDOMEN: Soft, non-tender, non-distended MUSCULOSKELETAL:  No edema; No deformity  SKIN: Warm and dry NEUROLOGIC:  Alert and oriented x  3 PSYCHIATRIC:  Normal affect   ASSESSMENT:    1. Ascending aorta dilation (HCC)   2. Primary hypertension   3. BMI 38.0-38.9,adult     PLAN:    In order of problems listed above:  Moderate to severe ascending aorta dilatation, ascending aorta measuring 4.8 cm on echo.  Obtain CT aorta chest and abdomen for complete visualization and evaluation of aorta size.  Refer to CT surgery. Hypertension, BP controlled.  Continue diltiazem. Obesity, low-calorie diet advised.  Follow-up in 3 to 4 months     Medication Adjustments/Labs and Tests Ordered: Current medicines are reviewed at length with the patient today.  Concerns regarding medicines are outlined above.  Orders Placed This Encounter  Procedures   CT ANGIO CHEST AORTA W/CM & OR WO/CM   CT ANGIO ABDOMEN W &/OR WO CONTRAST   Ambulatory referral to Cardiothoracic Surgery   No orders of the defined types were placed in this encounter.   Patient Instructions  Medication Instructions:   Your physician recommends that you continue on your current medications as directed. Please refer to the Current Medication list given to you today.  *If you need a refill on your cardiac medications before your next appointment, please call your pharmacy*   Lab Work:  None Ordered  If you have labs (blood work) drawn today and your tests are completely normal, you will receive your results only by: MyChart Message (if you have MyChart) OR A paper copy in the mail If you have any lab test that is abnormal or we need to change your treatment, we will call you to review the results.   Testing/Procedures:  Non-Cardiac CT Angiography (CTA), is a special type of CT scan that uses a computer to produce multi-dimensional views of major blood vessels throughout the body. In CT angiography, a contrast material is injected through an IV to help visualize the blood vessels    Follow-Up: At [facility], you and your health needs are our  priority.  As part of our continuing mission to provide you with exceptional heart care, we have created designated Provider Care Teams.  These Care Teams include your primary Cardiologist (physician) and Advanced Practice Providers (APPs -  Physician Assistants and Nurse Practitioners) who all work together to provide you with the care you need, when you need it.  We recommend signing up for the patient portal called "MyChart".  Sign up information is provided on this After Visit Summary.  MyChart is used to connect with patients for Virtual Visits (Telemedicine).  Patients are able to view lab/test results, encounter notes,  upcoming appointments, etc.  Non-urgent messages can be sent to your provider as well.   To learn more about what you can do with MyChart, go to [email or web address].    Your next appointment:   3 month(s)  Provider:   You may see Debbe Odea, MD or one of the following Advanced Practice Providers on your designated Care Team:   Nicolasa Ducking, NP Eula Listen, PA-C Cadence Fransico Michael, PA-C Charlsie Quest, NP    Signed, Debbe Odea, MD  08/28/2022 9:52 AM    [facility]

## 2022-08-28 NOTE — Patient Instructions (Signed)
Medication Instructions:   Your physician recommends that you continue on your current medications as directed. Please refer to the Current Medication list given to you today.  *If you need a refill on your cardiac medications before your next appointment, please call your pharmacy*   Lab Work:  None Ordered  If you have labs (blood work) drawn today and your tests are completely normal, you will receive your results only by: MyChart Message (if you have MyChart) OR A paper copy in the mail If you have any lab test that is abnormal or we need to change your treatment, we will call you to review the results.   Testing/Procedures:  Non-Cardiac CT Angiography (CTA), is a special type of CT scan that uses a computer to produce multi-dimensional views of major blood vessels throughout the body. In CT angiography, a contrast material is injected through an IV to help visualize the blood vessels    Follow-Up: At [facility], you and your health needs are our priority.  As part of our continuing mission to provide you with exceptional heart care, we have created designated Provider Care Teams.  These Care Teams include your primary Cardiologist (physician) and Advanced Practice Providers (APPs -  Physician Assistants and Nurse Practitioners) who all work together to provide you with the care you need, when you need it.  We recommend signing up for the patient portal called "MyChart".  Sign up information is provided on this After Visit Summary.  MyChart is used to connect with patients for Virtual Visits (Telemedicine).  Patients are able to view lab/test results, encounter notes, upcoming appointments, etc.  Non-urgent messages can be sent to your provider as well.   To learn more about what you can do with MyChart, go to [email or web address].    Your next appointment:   3 month(s)  Provider:   You may see Debbe Odea, MD or one of the following Advanced Practice Providers  on your designated Care Team:   Nicolasa Ducking, NP Eula Listen, PA-C Cadence Fransico Michael, PA-C Charlsie Quest, NP

## 2022-09-07 DIAGNOSIS — Z1389 Encounter for screening for other disorder: Secondary | ICD-10-CM | POA: Diagnosis not present

## 2022-09-07 DIAGNOSIS — Z0130 Encounter for examination of blood pressure without abnormal findings: Secondary | ICD-10-CM | POA: Diagnosis not present

## 2022-09-07 DIAGNOSIS — Z0131 Encounter for examination of blood pressure with abnormal findings: Secondary | ICD-10-CM | POA: Diagnosis not present

## 2022-09-07 DIAGNOSIS — Z23 Encounter for immunization: Secondary | ICD-10-CM | POA: Diagnosis not present

## 2022-09-07 DIAGNOSIS — Z1331 Encounter for screening for depression: Secondary | ICD-10-CM | POA: Diagnosis not present

## 2022-09-07 DIAGNOSIS — Z0000 Encounter for general adult medical examination without abnormal findings: Secondary | ICD-10-CM | POA: Diagnosis not present

## 2022-09-28 DIAGNOSIS — Z780 Asymptomatic menopausal state: Secondary | ICD-10-CM | POA: Diagnosis not present

## 2022-09-28 DIAGNOSIS — M85852 Other specified disorders of bone density and structure, left thigh: Secondary | ICD-10-CM | POA: Diagnosis not present

## 2022-09-28 DIAGNOSIS — Z13820 Encounter for screening for osteoporosis: Secondary | ICD-10-CM | POA: Diagnosis not present

## 2022-10-04 DIAGNOSIS — Z1211 Encounter for screening for malignant neoplasm of colon: Secondary | ICD-10-CM | POA: Diagnosis not present

## 2022-10-17 ENCOUNTER — Ambulatory Visit: Payer: Medicare HMO

## 2022-10-17 ENCOUNTER — Other Ambulatory Visit: Payer: Medicare HMO

## 2022-10-24 ENCOUNTER — Ambulatory Visit
Admission: RE | Admit: 2022-10-24 | Discharge: 2022-10-24 | Disposition: A | Discharge status: Home / Self Care | Payer: Medicare HMO | Source: Ambulatory Visit | Attending: Cardiology | Admitting: Cardiology

## 2022-10-24 DIAGNOSIS — I77810 Thoracic aortic ectasia: Secondary | ICD-10-CM | POA: Diagnosis not present

## 2022-10-24 LAB — POCT I-STAT CREATININE: Creatinine, Ser: 1.3 mg/dL — ABNORMAL HIGH (ref 0.44–1.00)

## 2022-10-24 MED ORDER — IOHEXOL 350 MG/ML SOLN
100.0000 mL | Freq: Once | INTRAVENOUS | Status: AC | PRN
  Administered 2022-10-24: 100 mL via INTRAVENOUS

## 2022-11-29 ENCOUNTER — Encounter: Payer: Self-pay | Admitting: Cardiology

## 2022-11-29 ENCOUNTER — Ambulatory Visit: Payer: Medicare HMO | Attending: Cardiology | Admitting: Cardiology

## 2022-11-29 VITALS — BP 142/86 | HR 63 | Ht 64.0 in | Wt 222.6 lb

## 2022-11-29 DIAGNOSIS — I10 Essential (primary) hypertension: Secondary | ICD-10-CM | POA: Diagnosis not present

## 2022-11-29 DIAGNOSIS — Z6838 Body mass index (BMI) 38.0-38.9, adult: Secondary | ICD-10-CM

## 2022-11-29 DIAGNOSIS — Z1389 Encounter for screening for other disorder: Secondary | ICD-10-CM | POA: Diagnosis not present

## 2022-11-29 DIAGNOSIS — I77810 Thoracic aortic ectasia: Secondary | ICD-10-CM | POA: Diagnosis not present

## 2022-11-29 DIAGNOSIS — H1130 Conjunctival hemorrhage, unspecified eye: Secondary | ICD-10-CM | POA: Diagnosis not present

## 2022-11-29 DIAGNOSIS — Z0131 Encounter for examination of blood pressure with abnormal findings: Secondary | ICD-10-CM | POA: Diagnosis not present

## 2022-11-29 NOTE — Progress Notes (Signed)
Cardiology Office Note:    Date:  11/29/2022   ID:  Kathleen Morgan, DOB 05-12-1955, MRN [id number]  PCP:  [facility]   [facility] Providers Cardiologist:  Debbe Odea, MD     Referring MD: [facility]   Chief Complaint  Patient presents with   Follow-up    Discuss cardiac testing results.  Patient denies new or acute cardiac problems/concerns today.  Patient woke up 4 days ago with redness/painful right eye that improves after migraine med Excedrin Migraine.  Plans on calling PCP or eye dr today.    History of Present Illness:    Kathleen Morgan is a 67 y.o. female with a hx of hypertension, moderately dilated ascending aorta who presents for follow-up.  Feels well, denies chest pain or shortness of breath.  Has a right eye erythema which she attributes to migraines, plans to call primary care physician today.  Chest CT was obtained to evaluate entire thoracic and abdominal aortic size due to ascending aorta aneurysm.  She presents for testing results  Prior notes/studies Echo 08/2022 - EF 60 to 65%,  mild aortic sclerosis, moderate ascending aorta dilatation 4.8 cm in diameter.   Past Medical History:  Diagnosis Date   Arthritis    Carpal tunnel syndrome    Heart palpitations    Hypertension     Past Surgical History:  Procedure Laterality Date   BACK SURGERY     CESAREAN SECTION     HERNIA REPAIR     NEPHRECTOMY      Current Medications: Current Meds  Medication Sig   DILT-XR 240 MG 24 hr capsule Take 480 mg by mouth daily. Take 2 capsules QD   famotidine (PEPCID) 20 MG tablet Take 20 mg by mouth daily as needed for heartburn.   PATADAY 0.7 % SOLN Apply 1 drop to eye daily as needed (allergies).     Allergies:   Patient has no known allergies.   Social History   Socioeconomic History   Marital status: Divorced    Spouse name: Not on file   Number of children: Not on file   Years of education: Not on  file   Highest education level: Not on file  Occupational History   Not on file  Tobacco Use   Smoking status: Never   Smokeless tobacco: Never  Vaping Use   Vaping status: Never Used  Substance and Sexual Activity   Alcohol use: No    Alcohol/week: 0.0 standard drinks of alcohol   Drug use: No   Sexual activity: Not on file  Other Topics Concern   Not on file  Social History Narrative   Not on file   Social Determinants of Health   Financial Resource Strain: Not on file  Food Insecurity: Not on file  Transportation Needs: Not on file  Physical Activity: Not on file  Stress: Not on file  Social Connections: Not on file     Family History: The patient's family history includes Congestive Heart Failure in her daughter and mother. There is no history of Breast cancer.  ROS:   Please see the history of present illness.     All other systems reviewed and are negative.  EKGs/Labs/Other Studies Reviewed:    The following studies were reviewed today:   EKG:  EKG not ordered today.   Recent Labs: 10/24/2022: Creatinine, Ser 1.30  Recent Lipid Panel No results found for: "CHOL", "TRIG", "HDL", "CHOLHDL", "VLDL", "LDLCALC", "LDLDIRECT"  Risk Assessment/Calculations:     Physical Exam:    VS:  BP (!) 142/86 (BP Location: Left Arm, Patient Position: Sitting, Cuff Size: Large)   Pulse 63   Ht 5\' 4"  (1.626 m)   Wt 222 lb 9.6 oz (101 kg)   SpO2 99%   BMI 38.21 kg/m     Wt Readings from Last 3 Encounters:  11/29/22 222 lb 9.6 oz (101 kg)  08/28/22 226 lb (102.5 kg)  07/19/22 225 lb 9.6 oz (102.3 kg)     GEN:  Well nourished, well developed in no acute distress HEENT: Normal NECK: No JVD; No carotid bruits CARDIAC: RRR, 2/6 systolic murmur RESPIRATORY:  Clear to auscultation without rales, wheezing or rhonchi  ABDOMEN: Soft, non-tender, non-distended MUSCULOSKELETAL:  No edema; No deformity  SKIN: Warm and dry NEUROLOGIC:  Alert and oriented x 3 PSYCHIATRIC:   Normal affect   ASSESSMENT:    1. Ascending aorta dilation (HCC)   2. Primary hypertension   3. BMI 38.0-38.9,adult    PLAN:    In order of problems listed above:  Moderate dilatation of ascending aorta measuring 4.8 cm on echo, 4.5 on chest CT. previously referred to CT surgery.  Waiting for appointment Hypertension, BP controlled.  Continue diltiazem. Obesity, low-calorie diet advised.  Follow-up in 1 year.     Medication Adjustments/Labs and Tests Ordered: Current medicines are reviewed at length with the patient today.  Concerns regarding medicines are outlined above.  No orders of the defined types were placed in this encounter.  No orders of the defined types were placed in this encounter.   Patient Instructions  Medication Instructions:  No changes *If you need a refill on your cardiac medications before your next appointment, please call your pharmacy*   Lab Work: None ordered If you have labs (blood work) drawn today and your tests are completely normal, you will receive your results only by: MyChart Message (if you have MyChart) OR A paper copy in the mail If you have any lab test that is abnormal or we need to change your treatment, we will call you to review the results.   Testing/Procedures: None ordered   Follow-Up: At [facility], you and your health needs are our priority.  As part of our continuing mission to provide you with exceptional heart care, we have created designated Provider Care Teams.  These Care Teams include your primary Cardiologist (physician) and Advanced Practice Providers (APPs -  Physician Assistants and Nurse Practitioners) who all work together to provide you with the care you need, when you need it.  We recommend signing up for the patient portal called "MyChart".  Sign up information is provided on this After Visit Summary.  MyChart is used to connect with patients for Virtual Visits (Telemedicine).  Patients are able  to view lab/test results, encounter notes, upcoming appointments, etc.  Non-urgent messages can be sent to your provider as well.   To learn more about what you can do with MyChart, go to [email or web address].    Your next appointment:   12 month(s)  Provider:   You may see Debbe Odea, MD or one of the following Advanced Practice Providers on your designated Care Team:   Nicolasa Ducking, NP Eula Listen, PA-C Cadence Fransico Michael, PA-C Charlsie Quest, NP      Signed, Debbe Odea, MD  11/29/2022 9:24 AM    [facility]

## 2022-11-29 NOTE — Patient Instructions (Signed)
Medication Instructions:  No changes *If you need a refill on your cardiac medications before your next appointment, please call your pharmacy*   Lab Work: None ordered If you have labs (blood work) drawn today and your tests are completely normal, you will receive your results only by: MyChart Message (if you have MyChart) OR A paper copy in the mail If you have any lab test that is abnormal or we need to change your treatment, we will call you to review the results.   Testing/Procedures: None ordered   Follow-Up: At [facility], you and your health needs are our priority.  As part of our continuing mission to provide you with exceptional heart care, we have created designated Provider Care Teams.  These Care Teams include your primary Cardiologist (physician) and Advanced Practice Providers (APPs -  Physician Assistants and Nurse Practitioners) who all work together to provide you with the care you need, when you need it.  We recommend signing up for the patient portal called "MyChart".  Sign up information is provided on this After Visit Summary.  MyChart is used to connect with patients for Virtual Visits (Telemedicine).  Patients are able to view lab/test results, encounter notes, upcoming appointments, etc.  Non-urgent messages can be sent to your provider as well.   To learn more about what you can do with MyChart, go to [email or web address].    Your next appointment:   12 month(s)  Provider:   You may see Debbe Odea, MD or one of the following Advanced Practice Providers on your designated Care Team:   Nicolasa Ducking, NP Eula Listen, PA-C Cadence Fransico Michael, PA-C Charlsie Quest, NP

## 2022-12-01 DIAGNOSIS — H0288B Meibomian gland dysfunction left eye, upper and lower eyelids: Secondary | ICD-10-CM | POA: Diagnosis not present

## 2022-12-01 DIAGNOSIS — H0288A Meibomian gland dysfunction right eye, upper and lower eyelids: Secondary | ICD-10-CM | POA: Diagnosis not present

## 2022-12-01 DIAGNOSIS — H1045 Other chronic allergic conjunctivitis: Secondary | ICD-10-CM | POA: Diagnosis not present

## 2022-12-27 ENCOUNTER — Encounter (HOSPITAL_COMMUNITY): Payer: Self-pay | Admitting: Internal Medicine

## 2022-12-27 ENCOUNTER — Encounter: Payer: Self-pay | Admitting: Internal Medicine

## 2022-12-27 ENCOUNTER — Other Ambulatory Visit: Payer: Self-pay

## 2022-12-27 ENCOUNTER — Emergency Department: Payer: Medicare HMO

## 2022-12-27 ENCOUNTER — Inpatient Hospital Stay (HOSPITAL_COMMUNITY)
Admission: AD | Admit: 2022-12-27 | Discharge: 2022-12-31 | DRG: 444 | Disposition: A | Discharge status: Home / Self Care | Payer: Medicare HMO | Source: Other Acute Inpatient Hospital | Attending: Family Medicine | Admitting: Family Medicine

## 2022-12-27 ENCOUNTER — Encounter (HOSPITAL_COMMUNITY): Payer: Self-pay

## 2022-12-27 ENCOUNTER — Emergency Department
Admission: EM | Admit: 2022-12-27 | Discharge: 2022-12-27 | Disposition: A | Discharge status: Other Acute Inpatient Hospital | Payer: Medicare HMO | Attending: Emergency Medicine | Admitting: Emergency Medicine

## 2022-12-27 DIAGNOSIS — Z9049 Acquired absence of other specified parts of digestive tract: Secondary | ICD-10-CM | POA: Diagnosis not present

## 2022-12-27 DIAGNOSIS — K851 Biliary acute pancreatitis without necrosis or infection: Secondary | ICD-10-CM

## 2022-12-27 DIAGNOSIS — K8050 Calculus of bile duct without cholangitis or cholecystitis without obstruction: Principal | ICD-10-CM | POA: Diagnosis present

## 2022-12-27 DIAGNOSIS — R112 Nausea with vomiting, unspecified: Secondary | ICD-10-CM | POA: Diagnosis not present

## 2022-12-27 DIAGNOSIS — R932 Abnormal findings on diagnostic imaging of liver and biliary tract: Secondary | ICD-10-CM | POA: Diagnosis not present

## 2022-12-27 DIAGNOSIS — D259 Leiomyoma of uterus, unspecified: Secondary | ICD-10-CM | POA: Diagnosis not present

## 2022-12-27 DIAGNOSIS — I129 Hypertensive chronic kidney disease with stage 1 through stage 4 chronic kidney disease, or unspecified chronic kidney disease: Secondary | ICD-10-CM | POA: Diagnosis present

## 2022-12-27 DIAGNOSIS — R9431 Abnormal electrocardiogram [ECG] [EKG]: Secondary | ICD-10-CM | POA: Diagnosis not present

## 2022-12-27 DIAGNOSIS — Z5982 Transportation insecurity: Secondary | ICD-10-CM | POA: Diagnosis not present

## 2022-12-27 DIAGNOSIS — Z905 Acquired absence of kidney: Secondary | ICD-10-CM | POA: Diagnosis not present

## 2022-12-27 DIAGNOSIS — K859 Acute pancreatitis without necrosis or infection, unspecified: Secondary | ICD-10-CM

## 2022-12-27 DIAGNOSIS — K8061 Calculus of gallbladder and bile duct with cholecystitis, unspecified, with obstruction: Principal | ICD-10-CM | POA: Diagnosis present

## 2022-12-27 DIAGNOSIS — Z6839 Body mass index (BMI) 39.0-39.9, adult: Secondary | ICD-10-CM

## 2022-12-27 DIAGNOSIS — K838 Other specified diseases of biliary tract: Secondary | ICD-10-CM | POA: Diagnosis not present

## 2022-12-27 DIAGNOSIS — I10 Essential (primary) hypertension: Secondary | ICD-10-CM | POA: Diagnosis not present

## 2022-12-27 DIAGNOSIS — K219 Gastro-esophageal reflux disease without esophagitis: Secondary | ICD-10-CM | POA: Diagnosis present

## 2022-12-27 DIAGNOSIS — K8051 Calculus of bile duct without cholangitis or cholecystitis with obstruction: Secondary | ICD-10-CM | POA: Diagnosis not present

## 2022-12-27 DIAGNOSIS — R1084 Generalized abdominal pain: Secondary | ICD-10-CM | POA: Diagnosis not present

## 2022-12-27 DIAGNOSIS — Z8249 Family history of ischemic heart disease and other diseases of the circulatory system: Secondary | ICD-10-CM | POA: Diagnosis not present

## 2022-12-27 DIAGNOSIS — N390 Urinary tract infection, site not specified: Secondary | ICD-10-CM | POA: Diagnosis not present

## 2022-12-27 DIAGNOSIS — K8689 Other specified diseases of pancreas: Secondary | ICD-10-CM | POA: Diagnosis not present

## 2022-12-27 DIAGNOSIS — R109 Unspecified abdominal pain: Secondary | ICD-10-CM | POA: Diagnosis not present

## 2022-12-27 DIAGNOSIS — Z79899 Other long term (current) drug therapy: Secondary | ICD-10-CM | POA: Diagnosis not present

## 2022-12-27 DIAGNOSIS — N179 Acute kidney failure, unspecified: Secondary | ICD-10-CM | POA: Diagnosis present

## 2022-12-27 DIAGNOSIS — K5730 Diverticulosis of large intestine without perforation or abscess without bleeding: Secondary | ICD-10-CM | POA: Diagnosis not present

## 2022-12-27 DIAGNOSIS — R1111 Vomiting without nausea: Secondary | ICD-10-CM | POA: Diagnosis not present

## 2022-12-27 LAB — COMPREHENSIVE METABOLIC PANEL
ALT: 165 U/L — ABNORMAL HIGH (ref 0–44)
AST: 184 U/L — ABNORMAL HIGH (ref 15–41)
Albumin: 3.9 g/dL (ref 3.5–5.0)
Alkaline Phosphatase: 143 U/L — ABNORMAL HIGH (ref 38–126)
Anion gap: 10 (ref 5–15)
BUN: 19 mg/dL (ref 8–23)
CO2: 24 mmol/L (ref 22–32)
Calcium: 9.1 mg/dL (ref 8.9–10.3)
Chloride: 101 mmol/L (ref 98–111)
Creatinine, Ser: 1.18 mg/dL — ABNORMAL HIGH (ref 0.44–1.00)
GFR, Estimated: 50 mL/min — ABNORMAL LOW (ref >60)
Glucose, Bld: 158 mg/dL — ABNORMAL HIGH (ref 70–99)
Potassium: 3.9 mmol/L (ref 3.5–5.1)
Sodium: 135 mmol/L (ref 135–145)
Total Bilirubin: 1.6 mg/dL — ABNORMAL HIGH (ref 0.3–1.2)
Total Protein: 8.8 g/dL — ABNORMAL HIGH (ref 6.5–8.1)

## 2022-12-27 LAB — URINALYSIS, ROUTINE W REFLEX MICROSCOPIC
Bilirubin Urine: NEGATIVE
Glucose, UA: NEGATIVE mg/dL
Ketones, ur: NEGATIVE mg/dL
Nitrite: NEGATIVE
Protein, ur: 30 mg/dL — AB
Specific Gravity, Urine: 1.017 (ref 1.005–1.030)
WBC, UA: >50 WBC/hpf (ref 0–5)
pH: 5 (ref 5.0–8.0)

## 2022-12-27 LAB — CBC
HCT: 36.5 % (ref 36.0–46.0)
Hemoglobin: 11.8 g/dL — ABNORMAL LOW (ref 12.0–15.0)
MCH: 29.7 pg (ref 26.0–34.0)
MCHC: 32.3 g/dL (ref 30.0–36.0)
MCV: 91.9 fL (ref 80.0–100.0)
Platelets: 297 10*3/uL (ref 150–400)
RBC: 3.97 MIL/uL (ref 3.87–5.11)
RDW: 13.2 % (ref 11.5–15.5)
WBC: 13.9 10*3/uL — ABNORMAL HIGH (ref 4.0–10.5)
nRBC: 0 % (ref 0.0–0.2)

## 2022-12-27 LAB — TROPONIN I (HIGH SENSITIVITY): Troponin I (High Sensitivity): 5 ng/L (ref <18)

## 2022-12-27 LAB — LIPASE, BLOOD: Lipase: 1446 U/L — ABNORMAL HIGH (ref 11–51)

## 2022-12-27 MED ORDER — SODIUM CHLORIDE 0.9 % IV SOLN: INTRAVENOUS | Status: DC

## 2022-12-27 MED ORDER — MORPHINE SULFATE (PF) 4 MG/ML IV SOLN
4.0000 mg | Freq: Once | INTRAVENOUS | Status: AC
  Administered 2022-12-27: 4 mg via INTRAVENOUS
  Filled 2022-12-27: qty 1

## 2022-12-27 MED ORDER — GADOBUTROL 1 MMOL/ML IV SOLN
10.0000 mL | Freq: Once | INTRAVENOUS | Status: AC | PRN
  Administered 2022-12-27: 10 mL via INTRAVENOUS

## 2022-12-27 MED ORDER — ENOXAPARIN SODIUM 40 MG/0.4ML IJ SOSY
40.0000 mg | PREFILLED_SYRINGE | Freq: Every day | INTRAMUSCULAR | Status: AC
  Administered 2022-12-27 – 2022-12-28 (×2): 40 mg via SUBCUTANEOUS
  Filled 2022-12-27 (×2): qty 0.4

## 2022-12-27 MED ORDER — INFLUENZA VAC A&B SURF ANT ADJ 0.5 ML IM SUSY
0.5000 mL | PREFILLED_SYRINGE | INTRAMUSCULAR | Status: DC
  Filled 2022-12-27: qty 0.5

## 2022-12-27 MED ORDER — SODIUM CHLORIDE 0.9 % IV SOLN
2.0000 g | Freq: Once | INTRAVENOUS | Status: AC
  Administered 2022-12-27: 2 g via INTRAVENOUS
  Filled 2022-12-27: qty 20

## 2022-12-27 MED ORDER — IOHEXOL 350 MG/ML SOLN
100.0000 mL | Freq: Once | INTRAVENOUS | Status: AC | PRN
  Administered 2022-12-27: 100 mL via INTRAVENOUS

## 2022-12-27 MED ORDER — ONDANSETRON HCL 4 MG/2ML IJ SOLN
4.0000 mg | Freq: Four times a day (QID) | INTRAMUSCULAR | Status: DC | PRN
  Administered 2022-12-29: 4 mg via INTRAVENOUS
  Filled 2022-12-27: qty 2

## 2022-12-27 MED ORDER — ONDANSETRON HCL 4 MG/2ML IJ SOLN
4.0000 mg | Freq: Once | INTRAMUSCULAR | Status: AC
  Administered 2022-12-27: 4 mg via INTRAVENOUS
  Filled 2022-12-27: qty 2

## 2022-12-27 MED ORDER — ALBUTEROL SULFATE (2.5 MG/3ML) 0.083% IN NEBU: 2.5000 mg | INHALATION_SOLUTION | RESPIRATORY_TRACT | Status: DC | PRN

## 2022-12-27 MED ORDER — HYDROMORPHONE HCL 1 MG/ML IJ SOLN
1.0000 mg | INTRAMUSCULAR | Status: DC | PRN
  Administered 2022-12-27 – 2022-12-29 (×3): 1 mg via INTRAVENOUS
  Filled 2022-12-27 (×3): qty 1

## 2022-12-27 MED ORDER — PANTOPRAZOLE SODIUM 40 MG IV SOLR
40.0000 mg | INTRAVENOUS | Status: DC
  Administered 2022-12-27 – 2022-12-30 (×4): 40 mg via INTRAVENOUS
  Filled 2022-12-27 (×4): qty 10

## 2022-12-27 MED ORDER — FENTANYL CITRATE PF 50 MCG/ML IJ SOSY
50.0000 ug | PREFILLED_SYRINGE | Freq: Once | INTRAMUSCULAR | Status: AC
  Administered 2022-12-27: 50 ug via INTRAVENOUS
  Filled 2022-12-27: qty 1

## 2022-12-27 MED ORDER — HYDRALAZINE HCL 20 MG/ML IJ SOLN
5.0000 mg | Freq: Four times a day (QID) | INTRAMUSCULAR | Status: DC | PRN
  Administered 2022-12-29 – 2022-12-30 (×2): 5 mg via INTRAVENOUS
  Filled 2022-12-27 (×2): qty 1

## 2022-12-27 MED ORDER — SODIUM CHLORIDE 0.9 % IV BOLUS (SEPSIS)
1000.0000 mL | Freq: Once | INTRAVENOUS | Status: AC
  Administered 2022-12-27: 1000 mL via INTRAVENOUS

## 2022-12-27 MED ORDER — ACETAMINOPHEN 325 MG PO TABS
650.0000 mg | ORAL_TABLET | ORAL | Status: DC | PRN
  Filled 2022-12-27: qty 2

## 2022-12-27 NOTE — ED Notes (Signed)
Pt given mouth moisturizer

## 2022-12-27 NOTE — Progress Notes (Signed)
Plan of Care Note for accepted transfer   Patient: Kathleen Morgan MRN: [id number]   DOA: 12/27/2022  Facility requesting transfer: [facility]. Requesting Provider: Artis Delay, MD Reason for transfer: Gallstone pancreatitis. Facility course:  67 year old female with past medical history of arthritis, carpal tunnel syndrome, heart palpitations, hypertension, class II obesity who presented to the emergency department with abdominal pain radiating to her back associated with nausea and vomiting since eating at [facility] on Sunday.  She received 1000 mL normal saline bolus, ondansetron 4 mg IVP, morphine 4 mg IVP, fentanyl 50 mcg IVP and ceftriaxone 2 g IVPB for possible UTI.  Lab work: Urinalysis, Routine w reflex microscopic -Urine, Clean Catch [[id number]] (Abnormal)   Collected: 12/27/22 0235   Updated: 12/27/22 0317   Specimen Source: Urine, Clean Catch    Color, Urine AMBER Abnormal    APPearance CLOUDY Abnormal    Specific Gravity, Urine 1.017   pH 5.0   Glucose, UA NEGATIVE mg/dL   Hgb urine dipstick SMALL Abnormal    Bilirubin Urine NEGATIVE   Ketones, ur NEGATIVE mg/dL   Protein, ur 30 Abnormal  mg/dL   Nitrite NEGATIVE   Leukocytes,Ua LARGE Abnormal    RBC / HPF 21-50 RBC/hpf   WBC, UA >50 WBC/hpf   Bacteria, UA FEW Abnormal    Squamous Epithelial / HPF 11-20 /HPF   WBC Clumps PRESENT   Mucus PRESENT   Non Squamous Epithelial PRESENT Abnormal   Lipase, blood [[id number]] (Abnormal)   Collected: 12/27/22 0115   Updated: 12/27/22 0316   Specimen Type: Blood    Lipase 1,446 High  U/L  Comprehensive metabolic panel [[id number]] (Abnormal)   Collected: 12/27/22 0115   Updated: 12/27/22 0254   Specimen Type: Blood    Sodium 135 mmol/L   Potassium 3.9 mmol/L   Chloride 101 mmol/L   CO2 24 mmol/L   Glucose, Bld 158 High  mg/dL   BUN 19 mg/dL   Creatinine, Ser 2.84 High  mg/dL   Calcium 9.1 mg/dL   Total Protein 8.8 High  g/dL   Albumin 3.9 g/dL   AST 132 High  U/L   ALT  165 High  U/L   Alkaline Phosphatase 143 High  U/L   Total Bilirubin 1.6 High  mg/dL   GFR, Estimated 50 Low  mL/min   Anion gap 10  Troponin I (High Sensitivity) [[id number]]   Collected: 12/27/22 0115   Updated: 12/27/22 0225    Troponin I (High Sensitivity) 5 ng/L  CBC [[id number]] (Abnormal)   Collected: 12/27/22 0115   Updated: 12/27/22 0121   Specimen Type: Blood    WBC 13.9 High  K/uL   RBC 3.97 MIL/uL   Hemoglobin 11.8 Low  g/dL   HCT 42.5 %   MCV 95.6 fL   MCH 29.7 pg   MCHC 32.3 g/dL   RDW 38.7 %   Platelets 297 K/uL   nRBC 0.0 %   Imaging:   FINDINGS: Lower chest: No acute abnormality.   Hepatobiliary: No focal liver abnormality is seen. Status post cholecystectomy. Moderate to marked severity intrahepatic and extrahepatic biliary dilatation is seen. The common bile duct measures 13 mm in diameter.   Pancreas: Unremarkable. No pancreatic ductal dilatation or surrounding inflammatory changes.   Spleen: Normal in size without focal abnormality.   Adrenals/Urinary Tract: Adrenal glands are unremarkable. The right kidney is surgically absent. The left kidney is normal, without renal calculi, focal lesion, or hydronephrosis. Bladder is unremarkable.   Stomach/Bowel: Stomach is within  normal limits. Appendix appears normal. No evidence of bowel wall thickening, distention, or inflammatory changes. Noninflamed diverticula are seen scattered throughout the large bowel.   Vascular/Lymphatic: Aortic atherosclerosis. No enlarged abdominal or pelvic lymph nodes.   Reproductive: Multiple calcified uterine fibroids are seen. The bilateral adnexa are unremarkable.   Other: No abdominal wall hernia or abnormality. No abdominopelvic ascites.   Musculoskeletal: Multilevel degenerative changes seen throughout the lumbar spine with postoperative changes noted at the level of L5.   IMPRESSION: 1. Moderate to marked severity intrahepatic and extrahepatic  biliary dilatation, likely secondary to prior cholecystectomy. 2. Evidence of prior right nephrectomy. 3. Colonic diverticulosis. 4. Multiple calcified uterine fibroids. 5. Aortic atherosclerosis.   Aortic Atherosclerosis (ICD10-I70.0).   Electronically Signed   By: Aram Candela M.D.   On: 12/27/2022 03:35  MRCP abdomen  IMPRESSION: 1. Intra and extrahepatic biliary duct dilatation with 2 stones in the distal common bile duct, the larger measuring 4 mm and the more distal smaller stone measuring 3 mm. 2. Edema in and around the pancreatic head and pancreatico duodenal groove with edema in the porta hepatis. Imaging features may be secondary to the biliary disease although they do raise concern for acute pancreatitis. 3. 2.0 x 1.6 cm nodular focus of confluent soft tissue in the tail of the pancreas follows splenic signal characteristics and is similar in size and appearance to a CT scan from 11/20/2006. As such, this is felt to be a splenule, potentially of intrapancreatic location. 4. Right nephrectomy.    Electronically Signed   By: Kennith Center M.D.   On: 12/27/2022 07:03  Plan of care: The patient is accepted for admission to Med-surg  unit, at [facility] or [facility] depending on bed availability.  Dr. Barron Alvine accepted to see the patient in consult.  Please notify [facility] the patient's arrival.  Author: Bobette Mo, MD 12/27/2022  Check [email or web address] for on-call coverage.  Nursing staff, Please call [facility] Admits & Consults System-Wide number on Amion as soon as patient's arrival, so appropriate admitting provider can evaluate the pt.

## 2022-12-27 NOTE — ED Notes (Signed)
Patient returned from MRI.

## 2022-12-27 NOTE — ED Triage Notes (Signed)
Patient wheeled to triage with complaints of worsening epigastric abdominal pain since Sunday after eating at [facility]. She states now the pain is radiating into her back. States she has also had vomiting, no diarrhea.

## 2022-12-27 NOTE — ED Notes (Signed)
Xray  [facility]  with  [facility]

## 2022-12-27 NOTE — ED Notes (Signed)
Patient transported to MRI 

## 2022-12-27 NOTE — Plan of Care (Signed)
  Problem: Education: Goal: Knowledge of General Education information will improve Description Including pain rating scale, medication(s)/side effects and non-pharmacologic comfort measures Outcome: Progressing   

## 2022-12-27 NOTE — ED Notes (Signed)
[facility] called for report.

## 2022-12-27 NOTE — ED Notes (Signed)
[facility]  CALLED  PER  DR  Fuller Plan  MD

## 2022-12-27 NOTE — H&P (Signed)
History and Physical  Kathleen Morgan KVQ:[id number] DOB: 08/06/1955 DOA: 12/27/2022  PCP: [facility]   Chief Complaint: Abdominal pain, vomiting  HPI: Kathleen Morgan is a 67 y.o. female with medical history significant for hypertension and acid reflux (to the hospital with choledocholithiasis and gallstone pancreatitis.  She had dinner at [employer] on 10/13 after which she started having abdominal pain, vomiting.  Has not really been able to eat anything since that time.  She denies any fevers or chills, she has had epigastric abdominal pain radiating to her back.  Denies any dizziness, shortness of breath, hematemesis, fevers or chills.  Came to the ER at [facility] for evaluation today, found to have elevated lipase, abnormal LFTs, bilirubin 1.6, alk phos 143.  Some slight renal insufficiency.  Imaging as noted below shows evidence of CBD stone and some associated pancreatitis.  Due to unavailability of ERCP at [facility] in the next few days, she was admitted to [facility].  Review of Systems: Please see HPI for pertinent positives and negatives. A complete 10 system review of systems are otherwise negative.  Past Medical History:  Diagnosis Date   Arthritis    Carpal tunnel syndrome    Heart palpitations    Hypertension    Past Surgical History:  Procedure Laterality Date   BACK SURGERY     CESAREAN SECTION     HERNIA REPAIR     NEPHRECTOMY      Social History:  reports that she has never smoked. She has never used smokeless tobacco. She reports that she does not drink alcohol and does not use drugs.   No Known Allergies  Family History  Problem Relation Age of Onset   Congestive Heart Failure Mother    Congestive Heart Failure Daughter    Breast cancer Neg Hx      Prior to Admission medications   Medication Sig Start Date End Date Taking? Authorizing Provider  DILT-XR 240 MG 24 hr capsule Take 480 mg by mouth daily. Take 2 capsules QD 04/18/22    [provider]  famotidine (PEPCID) 20 MG tablet Take 20 mg by mouth daily as needed for heartburn. 05/25/22   [provider]  meloxicam (MOBIC) 15 MG tablet Take 15 mg by mouth daily as needed for pain. Patient not taking: Reported on 11/29/2022 05/25/22   [provider]  naproxen (NAPROSYN) 500 MG tablet Take 1 tablet (500 mg total) by mouth 2 (two) times daily with a meal. Patient not taking: Reported on 07/19/2022 02/03/18   Bridget Hartshorn L, PA-C  PATADAY 0.7 % SOLN Apply 1 drop to eye daily as needed (allergies). 05/25/22   [provider]  predniSONE (DELTASONE) 10 MG tablet Take 1 tablet (10 mg total) by mouth daily. Day 1-3: take 4 tablets PO daily Day 4-6: take 3 tablets PO daily Day 7-9: take 2 tablets PO daily Day 10-12: take 1 tablet PO daily Patient not taking: Reported on 07/19/2022 06/10/19   Minna Antis, MD    Physical Exam: Ht 5\' 4"  (1.626 m)   Wt 104.1 kg   BMI 39.39 kg/m   General:  Alert, oriented, calm, in no acute distress, sitting up on the edge of the bed Eyes: EOMI, clear conjuctivae, white sclerea Neck: supple, no masses, trachea mildline  Cardiovascular: RRR, no murmurs or rubs, no peripheral edema  Respiratory: clear to auscultation bilaterally, no wheezes, no crackles  Abdomen: soft, tender in the epigastrium, nondistended, normal bowel tones  heard  Skin: dry, no rashes  Musculoskeletal: no joint effusions, normal range of motion  Psychiatric: appropriate affect, normal speech  Neurologic: extraocular muscles intact, clear speech, moving all extremities with intact sensorium         Labs on Admission:  Basic Metabolic Panel: Recent Labs  Lab 12/27/22 0115  NA 135  K 3.9  CL 101  CO2 24  GLUCOSE 158*  BUN 19  CREATININE 1.18*  CALCIUM 9.1   Liver Function Tests: Recent Labs  Lab 12/27/22 0115  AST 184*  ALT 165*  ALKPHOS 143*  BILITOT 1.6*  PROT 8.8*  ALBUMIN 3.9   Recent Labs  Lab  12/27/22 0115  LIPASE 1,446*   No results for input(s): "AMMONIA" in the last 168 hours. CBC: Recent Labs  Lab 12/27/22 0115  WBC 13.9*  HGB 11.8*  HCT 36.5  MCV 91.9  PLT 297   Cardiac Enzymes: No results for input(s): "CKTOTAL", "CKMB", "CKMBINDEX", "TROPONINI" in the last 168 hours.  BNP (last 3 results) No results for input(s): "BNP" in the last 8760 hours.  ProBNP (last 3 results) No results for input(s): "PROBNP" in the last 8760 hours.  CBG: No results for input(s): "GLUCAP" in the last 168 hours.  Radiological Exams on Admission: MR ABDOMEN MRCP W WO CONTAST  Result Date: 12/27/2022 CLINICAL DATA:  Nausea and vomiting. Biliary dilatation on previous CT. EXAM: MRI ABDOMEN WITHOUT AND WITH CONTRAST (INCLUDING MRCP) TECHNIQUE: Multiplanar multisequence MR imaging of the abdomen was performed both before and after the administration of intravenous contrast. Heavily T2-weighted images of the biliary and pancreatic ducts were obtained, and three-dimensional MRCP images were rendered by post processing. CONTRAST:  10mL GADAVIST GADOBUTROL 1 MMOL/ML IV SOLN COMPARISON:  CT scan from earlier the same day. FINDINGS: Lower chest: Dependent atelectasis.  Acute findings Hepatobiliary: No focal mass lesion evident within the liver. No focal restricted diffusion. Intra and extrahepatic biliary duct dilatation noted with common duct dilated up to 13 mm. Common bile duct in the head of the pancreas measures 11 mm just proximal to the ampulla. 2 stones are seen in the distal common bile duct, the larger measuring approximately 4 mm in the more distal smaller stone measuring about 3 mm (see 3D MRCP image 67 of series 5). Pancreas: There is some atrophy of the pancreatic parenchyma with prominence of the main pancreatic duct in the head of the pancreas measuring up to 5 mm diameter. There is some edema in and around the pancreatic head and the pancreatico duodenal groove with edema noted in the  porta hepatis. 2.0 x 1.6 cm nodular focus of confluent soft tissue is identified in the tail of the pancreas with the confluence of the soft tissue at upper portion to the remaining background pancreatic parenchyma. This nodule follows splenic signal characteristics and is similar in size and appearance to a CT scan from 11/20/2006. As such, this is felt to be a splenule, potentially of intrapancreatic location. Spleen: No splenomegaly. No suspicious focal mass lesion. See above. Adrenals/Urinary Tract: No adrenal nodule or mass. Right nephrectomy. Scattered tiny foci of non enhancement are seen in the left kidney, some of which demonstrate intrinsic T1 shortening on precontrast images. Although these are technically too small to characterize, imaging features are most suggestive of benign Bosniak I and II cysts requiring no specific further imaging follow-up. Stomach/Bowel: Stomach is unremarkable. No gastric wall thickening. No evidence of outlet obstruction. Duodenum is normally positioned as is the ligament of Treitz. There  is some Peri duodenal edema. No small bowel or colonic dilatation within the visualized abdomen. Vascular/Lymphatic: No abdominal aortic aneurysm. Portal vein, superior mesenteric vein, and splenic vein are patent. No abdominal lymphadenopathy Other:  No substantial ascites. Musculoskeletal: No focal suspicious marrow enhancement within the visualized bony anatomy. IMPRESSION: 1. Intra and extrahepatic biliary duct dilatation with 2 stones in the distal common bile duct, the larger measuring 4 mm and the more distal smaller stone measuring 3 mm. 2. Edema in and around the pancreatic head and pancreatico duodenal groove with edema in the porta hepatis. Imaging features may be secondary to the biliary disease although they do raise concern for acute pancreatitis. 3. 2.0 x 1.6 cm nodular focus of confluent soft tissue in the tail of the pancreas follows splenic signal characteristics and is  similar in size and appearance to a CT scan from 11/20/2006. As such, this is felt to be a splenule, potentially of intrapancreatic location. 4. Right nephrectomy. Electronically Signed   By: Kennith Center M.D.   On: 12/27/2022 07:03   MR 3D Recon At Scanner  Result Date: 12/27/2022 CLINICAL DATA:  Nausea and vomiting. Biliary dilatation on previous CT. EXAM: MRI ABDOMEN WITHOUT AND WITH CONTRAST (INCLUDING MRCP) TECHNIQUE: Multiplanar multisequence MR imaging of the abdomen was performed both before and after the administration of intravenous contrast. Heavily T2-weighted images of the biliary and pancreatic ducts were obtained, and three-dimensional MRCP images were rendered by post processing. CONTRAST:  10mL GADAVIST GADOBUTROL 1 MMOL/ML IV SOLN COMPARISON:  CT scan from earlier the same day. FINDINGS: Lower chest: Dependent atelectasis.  Acute findings Hepatobiliary: No focal mass lesion evident within the liver. No focal restricted diffusion. Intra and extrahepatic biliary duct dilatation noted with common duct dilated up to 13 mm. Common bile duct in the head of the pancreas measures 11 mm just proximal to the ampulla. 2 stones are seen in the distal common bile duct, the larger measuring approximately 4 mm in the more distal smaller stone measuring about 3 mm (see 3D MRCP image 67 of series 5). Pancreas: There is some atrophy of the pancreatic parenchyma with prominence of the main pancreatic duct in the head of the pancreas measuring up to 5 mm diameter. There is some edema in and around the pancreatic head and the pancreatico duodenal groove with edema noted in the porta hepatis. 2.0 x 1.6 cm nodular focus of confluent soft tissue is identified in the tail of the pancreas with the confluence of the soft tissue at upper portion to the remaining background pancreatic parenchyma. This nodule follows splenic signal characteristics and is similar in size and appearance to a CT scan from 11/20/2006. As  such, this is felt to be a splenule, potentially of intrapancreatic location. Spleen: No splenomegaly. No suspicious focal mass lesion. See above. Adrenals/Urinary Tract: No adrenal nodule or mass. Right nephrectomy. Scattered tiny foci of non enhancement are seen in the left kidney, some of which demonstrate intrinsic T1 shortening on precontrast images. Although these are technically too small to characterize, imaging features are most suggestive of benign Bosniak I and II cysts requiring no specific further imaging follow-up. Stomach/Bowel: Stomach is unremarkable. No gastric wall thickening. No evidence of outlet obstruction. Duodenum is normally positioned as is the ligament of Treitz. There is some Peri duodenal edema. No small bowel or colonic dilatation within the visualized abdomen. Vascular/Lymphatic: No abdominal aortic aneurysm. Portal vein, superior mesenteric vein, and splenic vein are patent. No abdominal lymphadenopathy Other:  No substantial  ascites. Musculoskeletal: No focal suspicious marrow enhancement within the visualized bony anatomy. IMPRESSION: 1. Intra and extrahepatic biliary duct dilatation with 2 stones in the distal common bile duct, the larger measuring 4 mm and the more distal smaller stone measuring 3 mm. 2. Edema in and around the pancreatic head and pancreatico duodenal groove with edema in the porta hepatis. Imaging features may be secondary to the biliary disease although they do raise concern for acute pancreatitis. 3. 2.0 x 1.6 cm nodular focus of confluent soft tissue in the tail of the pancreas follows splenic signal characteristics and is similar in size and appearance to a CT scan from 11/20/2006. As such, this is felt to be a splenule, potentially of intrapancreatic location. 4. Right nephrectomy. Electronically Signed   By: Kennith Center M.D.   On: 12/27/2022 07:03   CT ABDOMEN PELVIS W CONTRAST  Result Date: 12/27/2022 CLINICAL DATA:  Abdominal pain with nausea and  vomiting. EXAM: CT ABDOMEN AND PELVIS WITH CONTRAST TECHNIQUE: Multidetector CT imaging of the abdomen and pelvis was performed using the standard protocol following bolus administration of intravenous contrast. RADIATION DOSE REDUCTION: This exam was performed according to the departmental dose-optimization program which includes automated exposure control, adjustment of the mA and/or kV according to patient size and/or use of iterative reconstruction technique. CONTRAST:  OMNIPAQUE IOHEXOL 350 MG/ML SOLN COMPARISON:  October 24, 2022 FINDINGS: Lower chest: No acute abnormality. Hepatobiliary: No focal liver abnormality is seen. Status post cholecystectomy. Moderate to marked severity intrahepatic and extrahepatic biliary dilatation is seen. The common bile duct measures 13 mm in diameter. Pancreas: Unremarkable. No pancreatic ductal dilatation or surrounding inflammatory changes. Spleen: Normal in size without focal abnormality. Adrenals/Urinary Tract: Adrenal glands are unremarkable. The right kidney is surgically absent. The left kidney is normal, without renal calculi, focal lesion, or hydronephrosis. Bladder is unremarkable. Stomach/Bowel: Stomach is within normal limits. Appendix appears normal. No evidence of bowel wall thickening, distention, or inflammatory changes. Noninflamed diverticula are seen scattered throughout the large bowel. Vascular/Lymphatic: Aortic atherosclerosis. No enlarged abdominal or pelvic lymph nodes. Reproductive: Multiple calcified uterine fibroids are seen. The bilateral adnexa are unremarkable. Other: No abdominal wall hernia or abnormality. No abdominopelvic ascites. Musculoskeletal: Multilevel degenerative changes seen throughout the lumbar spine with postoperative changes noted at the level of L5. IMPRESSION: 1. Moderate to marked severity intrahepatic and extrahepatic biliary dilatation, likely secondary to prior cholecystectomy. 2. Evidence of prior right nephrectomy. 3.  Colonic diverticulosis. 4. Multiple calcified uterine fibroids. 5. Aortic atherosclerosis. Aortic Atherosclerosis (ICD10-I70.0). Electronically Signed   By: Aram Candela M.D.   On: 12/27/2022 03:35    Assessment/Plan Kathleen Morgan is a 68 y.o. female with medical history significant for hypertension and acid reflux (to the hospital with choledocholithiasis and gallstone pancreatitis.  Choledocholithiasis-in the setting of prior cholecystectomy, CT as above shows moderate intrahepatic and extrahepatic biliary dilatation, and 2 CBD stones seen on MRCP. -Inpatient admission to MedSurg -Pain and nausea control as needed -N.p.o. after midnight on 10/17 for planned ERCP -Discussed with GI, who has seen the patient in consultation  Pancreatitis-likely due to gallstones -Keep n.p.o. except for ice chips and meds -IV normal saline 125 cc/h -Pain and nausea control as needed  CKD stage III-appears to be stable  Hypertension-likely exacerbated due to acute pain -Continue home medications -IV hydralazine for uncontrolled blood pressure despite this  GERD-IV PPI  DVT prophylaxis: Lovenox   Full code  Consults called: GI Dr. Lorenso Quarry  Admission status: The appropriate patient  status for this patient is INPATIENT. Inpatient status is judged to be reasonable and necessary in order to provide the required intensity of service to ensure the patient's safety. The patient's presenting symptoms, physical exam findings, and initial radiographic and laboratory data in the context of their chronic comorbidities is felt to place them at high risk for further clinical deterioration. Furthermore, it is not anticipated that the patient will be medically stable for discharge from the hospital within 2 midnights of admission.    I certify that at the point of admission it is my clinical judgment that the patient will require inpatient hospital care spanning beyond 2 midnights from the point of admission due  to high intensity of service, high risk for further deterioration and high frequency of surveillance required  Time spent: 59 minutes  Gwynne Kemnitz Sharlette Dense MD [facility] Pager [phone]  If 7PM-7AM, please contact night-coverage [email or web address] Password [email or web address]  12/27/2022, 5:08 PM

## 2022-12-27 NOTE — ED Provider Notes (Addendum)
7:18 AM Assumed care for off going team.   Blood pressure 123/61, pulse (!) 59, temperature 98.2 F (36.8 C), temperature source Oral, resp. rate 13, height 5\' 4"  (1.626 m), weight 101.2 kg, SpO2 99%.  See their HPI for full report but in brief pending MRI   IMPRESSION: 1. Intra and extrahepatic biliary duct dilatation with 2 stones in the distal common bile duct, the larger measuring 4 mm and the more distal smaller stone measuring 3 mm. 2. Edema in and around the pancreatic head and pancreatico duodenal groove with edema in the porta hepatis. Imaging features may be secondary to the biliary disease although they do raise concern for acute pancreatitis. 3. 2.0 x 1.6 cm nodular focus of confluent soft tissue in the tail of the pancreas follows splenic signal characteristics and is similar in size and appearance to a CT scan from 11/20/2006. As such, this is felt to be a splenule, potentially of intrapancreatic location. 4. Right nephrectomy.  7:19 AM discussed the case with Dr. Servando Snare and he has no availability to do ERCP over the next 2 days.  Therefore recommends transfer.  I will discuss with [facility] given patient's preference to go there first.  8:19 AM d/w [facility] dr. Lanetta Inch- he states that we can admit the patient to our hospital and patient can go over for ERCP same day and then trasport back given they have no beds free at [facility].  I discussed with patient this option but she is worried about having to go back and forth between the hospital and then actually staying at this hospital.  We discussed different options and she would prefer to be transferred to [facility] if they have bed availability  9:15 AM d/w Dr. Barron Alvine okay to transfer per GI to [facility] or [facility]   Accepted by Dr Robb Matar  Requested I call her daughter and stated that she never picks up and is requested that I leave a message which  I did.         Concha Se, MD 12/27/22 [id number]    Concha Se, MD 12/27/22 [phone]

## 2022-12-27 NOTE — ED Provider Notes (Addendum)
[facility] Provider Note    Event Date/Time   First MD Initiated Contact with Patient 12/27/22 0207     (approximate)   History   Abdominal Pain   HPI  Kathleen Morgan is a 67 y.o. female with history of hypertension,, dilation of the ascending aorta, previous cholecystectomy, right nephrectomy, C-section, hernia repair who presents to the emergency department with nausea and vomiting, generalized abdominal pain for the past 2 days.  No fever, diarrhea, dysuria, hematuria, vaginal bleeding or discharge.  No chest pain or shortness of breath.   History provided by patient.    Past Medical History:  Diagnosis Date   Arthritis    Carpal tunnel syndrome    Heart palpitations    Hypertension     Past Surgical History:  Procedure Laterality Date   BACK SURGERY     CESAREAN SECTION     HERNIA REPAIR     NEPHRECTOMY      MEDICATIONS:  Prior to Admission medications   Medication Sig Start Date End Date Taking? Authorizing Provider  DILT-XR 240 MG 24 hr capsule Take 480 mg by mouth daily. Take 2 capsules QD 04/18/22   [provider]  famotidine (PEPCID) 20 MG tablet Take 20 mg by mouth daily as needed for heartburn. 05/25/22   [provider]  meloxicam (MOBIC) 15 MG tablet Take 15 mg by mouth daily as needed for pain. Patient not taking: Reported on 11/29/2022 05/25/22   [provider]  naproxen (NAPROSYN) 500 MG tablet Take 1 tablet (500 mg total) by mouth 2 (two) times daily with a meal. Patient not taking: Reported on 07/19/2022 02/03/18   Bridget Hartshorn L, PA-C  PATADAY 0.7 % SOLN Apply 1 drop to eye daily as needed (allergies). 05/25/22   [provider]  predniSONE (DELTASONE) 10 MG tablet Take 1 tablet (10 mg total) by mouth daily. Day 1-3: take 4 tablets PO daily Day 4-6: take 3 tablets PO daily Day 7-9: take 2 tablets PO daily Day 10-12: take 1 tablet PO daily Patient not taking: Reported on 07/19/2022 06/10/19    Minna Antis, MD    Physical Exam   Triage Vital Signs: ED Triage Vitals  Encounter Vitals Group     BP 12/27/22 0104 (!) 154/76     Systolic BP Percentile --      Diastolic BP Percentile --      Pulse Rate 12/27/22 0104 75     Resp 12/27/22 0104 18     Temp 12/27/22 0104 98 F (36.7 C)     Temp Source 12/27/22 0104 Oral     SpO2 12/27/22 0104 100 %     Weight 12/27/22 0105 223 lb (101.2 kg)     Height 12/27/22 0105 5\' 4"  (1.626 m)     Head Circumference --      Peak Flow --      Pain Score 12/27/22 0105 9     Pain Loc --      Pain Education --      Exclude from Growth Chart --     Most recent vital signs: Vitals:   12/27/22 0431 12/27/22 0530  BP: (!) 141/67 123/61  Pulse: 74 (!) 59  Resp: 14 13  Temp: 98.2 F (36.8 C)   SpO2: 97% 99%    CONSTITUTIONAL: Alert, responds appropriately to questions. Well-appearing; well-nourished HEAD: Normocephalic, atraumatic EYES: Conjunctivae clear, pupils appear equal, sclera nonicteric ENT: normal nose; moist mucous membranes NECK: Supple,  normal ROM CARD: RRR; S1 and S2 appreciated RESP: Normal chest excursion without splinting or tachypnea; breath sounds clear and equal bilaterally; no wheezes, no rhonchi, no rales, no hypoxia or respiratory distress, speaking full sentences ABD/GI: Non-distended; soft, tender to palpation throughout the abdomen, worse in the epigastric region, no guarding or rebound BACK: The back appears normal EXT: Normal ROM in all joints; no deformity noted, no edema SKIN: Normal color for age and race; warm; no rash on exposed skin NEURO: Moves all extremities equally, normal speech PSYCH: The patient's mood and manner are appropriate.   ED Results / Procedures / Treatments   LABS: (all labs ordered are listed, but only abnormal results are displayed) Labs Reviewed  LIPASE, BLOOD - Abnormal; Notable for the following components:      Result Value   Lipase 1,446 (*)    All other components  within normal limits  COMPREHENSIVE METABOLIC PANEL - Abnormal; Notable for the following components:   Glucose, Bld 158 (*)    Creatinine, Ser 1.18 (*)    Total Protein 8.8 (*)    AST 184 (*)    ALT 165 (*)    Alkaline Phosphatase 143 (*)    Total Bilirubin 1.6 (*)    GFR, Estimated 50 (*)    All other components within normal limits  CBC - Abnormal; Notable for the following components:   WBC 13.9 (*)    Hemoglobin 11.8 (*)    All other components within normal limits  URINALYSIS, ROUTINE W REFLEX MICROSCOPIC - Abnormal; Notable for the following components:   Color, Urine AMBER (*)    APPearance CLOUDY (*)    Hgb urine dipstick SMALL (*)    Protein, ur 30 (*)    Leukocytes,Ua LARGE (*)    Bacteria, UA FEW (*)    Non Squamous Epithelial PRESENT (*)    All other components within normal limits  URINE CULTURE  TROPONIN I (HIGH SENSITIVITY)     EKG:  EKG Interpretation Date/Time:  Wednesday December 27 2022 01:12:28 EDT Ventricular Rate:  75 PR Interval:  118 QRS Duration:  84 QT Interval:  382 QTC Calculation: 426 R Axis:   -22  Text Interpretation: Normal sinus rhythm Minimal voltage criteria for LVH, may be normal variant ( R in aVL ) Nonspecific ST and T wave abnormality Abnormal ECG When compared with ECG of 29-Nov-2010 07:34, No significant change was found Confirmed by Rochele Raring [phone]) on 12/27/2022 2:11:28 AM         RADIOLOGY: My personal review and interpretation of imaging:  CT shows intra and extrahepatic dilatation.  I have personally reviewed all radiology reports.   MR ABDOMEN MRCP W WO CONTAST  Result Date: 12/27/2022 CLINICAL DATA:  Nausea and vomiting. Biliary dilatation on previous CT. EXAM: MRI ABDOMEN WITHOUT AND WITH CONTRAST (INCLUDING MRCP) TECHNIQUE: Multiplanar multisequence MR imaging of the abdomen was performed both before and after the administration of intravenous contrast. Heavily T2-weighted images of the biliary and pancreatic  ducts were obtained, and three-dimensional MRCP images were rendered by post processing. CONTRAST:  10mL GADAVIST GADOBUTROL 1 MMOL/ML IV SOLN COMPARISON:  CT scan from earlier the same day. FINDINGS: Lower chest: Dependent atelectasis.  Acute findings Hepatobiliary: No focal mass lesion evident within the liver. No focal restricted diffusion. Intra and extrahepatic biliary duct dilatation noted with common duct dilated up to 13 mm. Common bile duct in the head of the pancreas measures 11 mm just proximal to the ampulla. 2 stones are  seen in the distal common bile duct, the larger measuring approximately 4 mm in the more distal smaller stone measuring about 3 mm (see 3D MRCP image 67 of series 5). Pancreas: There is some atrophy of the pancreatic parenchyma with prominence of the main pancreatic duct in the head of the pancreas measuring up to 5 mm diameter. There is some edema in and around the pancreatic head and the pancreatico duodenal groove with edema noted in the porta hepatis. 2.0 x 1.6 cm nodular focus of confluent soft tissue is identified in the tail of the pancreas with the confluence of the soft tissue at upper portion to the remaining background pancreatic parenchyma. This nodule follows splenic signal characteristics and is similar in size and appearance to a CT scan from 11/20/2006. As such, this is felt to be a splenule, potentially of intrapancreatic location. Spleen: No splenomegaly. No suspicious focal mass lesion. See above. Adrenals/Urinary Tract: No adrenal nodule or mass. Right nephrectomy. Scattered tiny foci of non enhancement are seen in the left kidney, some of which demonstrate intrinsic T1 shortening on precontrast images. Although these are technically too small to characterize, imaging features are most suggestive of benign Bosniak I and II cysts requiring no specific further imaging follow-up. Stomach/Bowel: Stomach is unremarkable. No gastric wall thickening. No evidence of outlet  obstruction. Duodenum is normally positioned as is the ligament of Treitz. There is some Peri duodenal edema. No small bowel or colonic dilatation within the visualized abdomen. Vascular/Lymphatic: No abdominal aortic aneurysm. Portal vein, superior mesenteric vein, and splenic vein are patent. No abdominal lymphadenopathy Other:  No substantial ascites. Musculoskeletal: No focal suspicious marrow enhancement within the visualized bony anatomy. IMPRESSION: 1. Intra and extrahepatic biliary duct dilatation with 2 stones in the distal common bile duct, the larger measuring 4 mm and the more distal smaller stone measuring 3 mm. 2. Edema in and around the pancreatic head and pancreatico duodenal groove with edema in the porta hepatis. Imaging features may be secondary to the biliary disease although they do raise concern for acute pancreatitis. 3. 2.0 x 1.6 cm nodular focus of confluent soft tissue in the tail of the pancreas follows splenic signal characteristics and is similar in size and appearance to a CT scan from 11/20/2006. As such, this is felt to be a splenule, potentially of intrapancreatic location. 4. Right nephrectomy. Electronically Signed   By: Kennith Center M.D.   On: 12/27/2022 07:03   MR 3D Recon At Scanner  Result Date: 12/27/2022 CLINICAL DATA:  Nausea and vomiting. Biliary dilatation on previous CT. EXAM: MRI ABDOMEN WITHOUT AND WITH CONTRAST (INCLUDING MRCP) TECHNIQUE: Multiplanar multisequence MR imaging of the abdomen was performed both before and after the administration of intravenous contrast. Heavily T2-weighted images of the biliary and pancreatic ducts were obtained, and three-dimensional MRCP images were rendered by post processing. CONTRAST:  10mL GADAVIST GADOBUTROL 1 MMOL/ML IV SOLN COMPARISON:  CT scan from earlier the same day. FINDINGS: Lower chest: Dependent atelectasis.  Acute findings Hepatobiliary: No focal mass lesion evident within the liver. No focal restricted diffusion.  Intra and extrahepatic biliary duct dilatation noted with common duct dilated up to 13 mm. Common bile duct in the head of the pancreas measures 11 mm just proximal to the ampulla. 2 stones are seen in the distal common bile duct, the larger measuring approximately 4 mm in the more distal smaller stone measuring about 3 mm (see 3D MRCP image 67 of series 5). Pancreas: There is some atrophy of  the pancreatic parenchyma with prominence of the main pancreatic duct in the head of the pancreas measuring up to 5 mm diameter. There is some edema in and around the pancreatic head and the pancreatico duodenal groove with edema noted in the porta hepatis. 2.0 x 1.6 cm nodular focus of confluent soft tissue is identified in the tail of the pancreas with the confluence of the soft tissue at upper portion to the remaining background pancreatic parenchyma. This nodule follows splenic signal characteristics and is similar in size and appearance to a CT scan from 11/20/2006. As such, this is felt to be a splenule, potentially of intrapancreatic location. Spleen: No splenomegaly. No suspicious focal mass lesion. See above. Adrenals/Urinary Tract: No adrenal nodule or mass. Right nephrectomy. Scattered tiny foci of non enhancement are seen in the left kidney, some of which demonstrate intrinsic T1 shortening on precontrast images. Although these are technically too small to characterize, imaging features are most suggestive of benign Bosniak I and II cysts requiring no specific further imaging follow-up. Stomach/Bowel: Stomach is unremarkable. No gastric wall thickening. No evidence of outlet obstruction. Duodenum is normally positioned as is the ligament of Treitz. There is some Peri duodenal edema. No small bowel or colonic dilatation within the visualized abdomen. Vascular/Lymphatic: No abdominal aortic aneurysm. Portal vein, superior mesenteric vein, and splenic vein are patent. No abdominal lymphadenopathy Other:  No substantial  ascites. Musculoskeletal: No focal suspicious marrow enhancement within the visualized bony anatomy. IMPRESSION: 1. Intra and extrahepatic biliary duct dilatation with 2 stones in the distal common bile duct, the larger measuring 4 mm and the more distal smaller stone measuring 3 mm. 2. Edema in and around the pancreatic head and pancreatico duodenal groove with edema in the porta hepatis. Imaging features may be secondary to the biliary disease although they do raise concern for acute pancreatitis. 3. 2.0 x 1.6 cm nodular focus of confluent soft tissue in the tail of the pancreas follows splenic signal characteristics and is similar in size and appearance to a CT scan from 11/20/2006. As such, this is felt to be a splenule, potentially of intrapancreatic location. 4. Right nephrectomy. Electronically Signed   By: Kennith Center M.D.   On: 12/27/2022 07:03   CT ABDOMEN PELVIS W CONTRAST  Result Date: 12/27/2022 CLINICAL DATA:  Abdominal pain with nausea and vomiting. EXAM: CT ABDOMEN AND PELVIS WITH CONTRAST TECHNIQUE: Multidetector CT imaging of the abdomen and pelvis was performed using the standard protocol following bolus administration of intravenous contrast. RADIATION DOSE REDUCTION: This exam was performed according to the departmental dose-optimization program which includes automated exposure control, adjustment of the mA and/or kV according to patient size and/or use of iterative reconstruction technique. CONTRAST:  OMNIPAQUE IOHEXOL 350 MG/ML SOLN COMPARISON:  October 24, 2022 FINDINGS: Lower chest: No acute abnormality. Hepatobiliary: No focal liver abnormality is seen. Status post cholecystectomy. Moderate to marked severity intrahepatic and extrahepatic biliary dilatation is seen. The common bile duct measures 13 mm in diameter. Pancreas: Unremarkable. No pancreatic ductal dilatation or surrounding inflammatory changes. Spleen: Normal in size without focal abnormality. Adrenals/Urinary Tract:  Adrenal glands are unremarkable. The right kidney is surgically absent. The left kidney is normal, without renal calculi, focal lesion, or hydronephrosis. Bladder is unremarkable. Stomach/Bowel: Stomach is within normal limits. Appendix appears normal. No evidence of bowel wall thickening, distention, or inflammatory changes. Noninflamed diverticula are seen scattered throughout the large bowel. Vascular/Lymphatic: Aortic atherosclerosis. No enlarged abdominal or pelvic lymph nodes. Reproductive: Multiple calcified uterine fibroids  are seen. The bilateral adnexa are unremarkable. Other: No abdominal wall hernia or abnormality. No abdominopelvic ascites. Musculoskeletal: Multilevel degenerative changes seen throughout the lumbar spine with postoperative changes noted at the level of L5. IMPRESSION: 1. Moderate to marked severity intrahepatic and extrahepatic biliary dilatation, likely secondary to prior cholecystectomy. 2. Evidence of prior right nephrectomy. 3. Colonic diverticulosis. 4. Multiple calcified uterine fibroids. 5. Aortic atherosclerosis. Aortic Atherosclerosis (ICD10-I70.0). Electronically Signed   By: Aram Candela M.D.   On: 12/27/2022 03:35     PROCEDURES:  Critical Care performed: No   CRITICAL CARE Performed by: Baxter Hire Valton Schwartz   Total critical care time: 0 minutes  Critical care time was exclusive of separately billable procedures and treating other patients.  Critical care was necessary to treat or prevent imminent or life-threatening deterioration.  Critical care was time spent personally by me on the following activities: development of treatment plan with patient and/or surrogate as well as nursing, discussions with consultants, evaluation of patient's response to treatment, examination of patient, obtaining history from patient or surrogate, ordering and performing treatments and interventions, ordering and review of laboratory studies, ordering and review of radiographic  studies, pulse oximetry and re-evaluation of patient's condition.   Marland Kitchen1-3 Lead EKG Interpretation  Performed by: Keiandra Sullenger, Layla Maw, DO Authorized by: Kyna Blahnik, Layla Maw, DO     Interpretation: normal     ECG rate:  75   ECG rate assessment: normal     Rhythm: sinus rhythm     Ectopy: none     Conduction: normal       IMPRESSION / MDM / ASSESSMENT AND PLAN / ED COURSE  I reviewed the triage vital signs and the nursing notes.    Patient here with abdominal pain, nausea and vomiting.  The patient is on the cardiac monitor to evaluate for evidence of arrhythmia and/or significant heart rate changes.   DIFFERENTIAL DIAGNOSIS (includes but not limited to):   GERD, gastritis, food poisoning, viral gastroenteritis, pancreatitis, colitis, bowel obstruction, UTI, appendicitis   Patient's presentation is most consistent with acute presentation with potential threat to life or bodily function.   PLAN: Will obtain abdominal labs, CT of the abdomen pelvis, urine.  Will give IV fluids, pain and nausea medicine.   MEDICATIONS GIVEN IN ED: Medications  sodium chloride 0.9 % bolus 1,000 mL (0 mLs Intravenous Stopped 12/27/22 0318)  ondansetron (ZOFRAN) injection 4 mg (4 mg Intravenous Given 12/27/22 0241)  fentaNYL (SUBLIMAZE) injection 50 mcg (50 mcg Intravenous Given 12/27/22 0241)  iohexol (OMNIPAQUE) 350 MG/ML injection 100 mL (100 mLs Intravenous Contrast Given 12/27/22 0308)  cefTRIAXone (ROCEPHIN) 2 g in sodium chloride 0.9 % 100 mL IVPB (0 g Intravenous Stopped 12/27/22 0548)  morphine (PF) 4 MG/ML injection 4 mg (4 mg Intravenous Given 12/27/22 0427)  gadobutrol (GADAVIST) 1 MMOL/ML injection 10 mL (10 mLs Intravenous Contrast Given 12/27/22 0626)     ED COURSE: 4:29 AM  Patient's labs showed a leukocytosis of 13,000.  She has elevated liver function tests which has been present before in 2021.  She has had previous cholecystectomy.  Her lipase today however is 1400.  CT of her  abdomen pelvis reviewed and interpreted by myself and the radiologist and shows no inflammation around the pancreas but there is hepatic ductal dilatation which could be from her previous cholecystectomy but given alterations in her liver function test and lipase, will obtain MRCP.  She also appears to have a UTI.  Will add on urine culture.  Will  give Rocephin.  Patient still having significant pain.  Will give further pain medicine.  Discussed with patient that she will need admission to the hospital.  5:49 AM Pt taken to MRI at this time.  6:58 AM  Signed out to oncoming EDP.  MRCP results pending at this time.  7:09 AM  Pt's MRCP shows choledocholithiasis and pancreatitis.  Oncoming ED physician will touch base with GI to see if patient can stay here at [facility] regional or needs to be transferred to an outside hospital for ERCP.  CONSULTS:  Will need admission.   OUTSIDE RECORDS REVIEWED: Reviewed last cardiology note on 11/29/2022.       FINAL CLINICAL IMPRESSION(S) / ED DIAGNOSES   Final diagnoses:  Nausea and vomiting in adult  Acute UTI  Acute pancreatitis, unspecified complication status, unspecified pancreatitis type  Choledocholithiasis     Rx / DC Orders   ED Discharge Orders     None        Note:  This document was prepared using Dragon voice recognition software and may include unintentional dictation errors.   Kirstie Larsen, Layla Maw, DO 12/27/22 [id number]    Malique Driskill, Layla Maw, DO 12/27/22 [id number]

## 2022-12-27 NOTE — ED Notes (Signed)
[facility]  called per  DR Fuller Plan  MD

## 2022-12-27 NOTE — ED Notes (Signed)
EMTALA Reviewed by this RN.
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
## 2022-12-27 NOTE — ED Notes (Signed)
Pt up to use restroom, no assistance required, pt with stable gait, NAD.

## 2022-12-27 NOTE — Consult Note (Signed)
GI Inpatient Consult Note  Reason for Consult: Choledocholithiasis    Attending Requesting Consult: Dr. Artis Delay, MD  History of Present Illness: Kathleen Morgan is a 67 y.o. female seen for evaluation of choledocholithiasis at the request of ED physician - Dr. Artis Delay. Patient has a PMH of HTN, obesity, s/p right nephrectomy, s/p cholecystectomy, and OA. She presented to the [facility] ED overnight for chief complaint of 2-day history of generalized abdominal pain, nausea, and vomiting. Upon presentation to the ED, she was hypertensive (154/76) and otherwise normal vital signs. Labs significant for WBC 13.9K, hemoglobin 11.8, AST 184, ALT 165, alk phos 143, total bilirubin 1.6, and lipase 1446. Imaging c/w intra and extrahepatic biliary ductal dilatation with 2 stones in distal CBD and acute pancreatitis. She received 1000 mL normal saline bolus, Zofran IV, morphine 4 mg, fentanyl, and 2 grams Ceftriaxone for possible UTI. GI consulted for further evaluation and management.   Patient seen and examined bedside this morning. She reports no further vomiting since being in the ED. Her pain is adequately controlled with pain medication. She denies any fevers, chills, or altered mental status. [facility] does not have availability to do ERCP here today per discussion with Dr. Servando Snare. She has been accepted for transfer to [facility] or [facility] depending on bed availability. She is awaiting transfer. No other complaints or concerns at this time.   Past Medical History:  Past Medical History:  Diagnosis Date   Arthritis    Carpal tunnel syndrome    Heart palpitations    Hypertension     Problem List: There are no problems to display for this patient.   Past Surgical History: Past Surgical History:  Procedure Laterality Date   BACK SURGERY     CESAREAN SECTION     HERNIA REPAIR     NEPHRECTOMY      Allergies: No Known Allergies  Home Medications: (Not in a hospital admission)  Home  medication reconciliation was completed with the patient.    Family History: family history includes Congestive Heart Failure in her daughter and mother.  The patient's family history is negative for inflammatory bowel disorders, GI malignancy, or solid organ transplantation.  Social History:   reports that she has never smoked. She has never used smokeless tobacco. She reports that she does not drink alcohol and does not use drugs. The patient denies ETOH, tobacco, or drug use.   Review of Systems: Constitutional: Weight is stable.  Eyes: No changes in vision. ENT: No oral lesions, sore throat.  GI: see HPI.  Heme/Lymph: No easy bruising.  CV: No chest pain.  GU: No hematuria.  Integumentary: No rashes.  Neuro: No headaches.  Psych: No depression/anxiety.  Endocrine: No heat/cold intolerance.  Allergic/Immunologic: No urticaria.  Resp: No cough, SOB.  Musculoskeletal: No joint swelling.    Physical Examination: BP 119/73   Pulse (!) 51   Temp 98 F (36.7 C) (Oral)   Resp 18   Ht 5\' 4"  (1.626 m)   Wt 101.2 kg   SpO2 100%   BMI 38.28 kg/m  Gen: NAD, alert and oriented x 4 HEENT: PEERLA, EOMI, Neck: supple, no JVD or thyromegaly Chest: CTA bilaterally, no wheezes, crackles, or other adventitious sounds CV: RRR, no m/g/c/r Abd: soft, NT, ND, +BS in all four quadrants; no HSM, guarding, ridigity, or rebound tenderness Ext: no edema, well perfused with 2+ pulses, Skin: no rash or lesions noted Lymph: no LAD  Data: Lab Results  Component Value Date   WBC 13.9 (H) 12/27/2022   HGB 11.8 (L) 12/27/2022   HCT 36.5 12/27/2022   MCV 91.9 12/27/2022   PLT 297 12/27/2022   Recent Labs  Lab 12/27/22 0115  HGB 11.8*   Lab Results  Component Value Date   NA 135 12/27/2022   K 3.9 12/27/2022   CL 101 12/27/2022   CO2 24 12/27/2022   BUN 19 12/27/2022   CREATININE 1.18 (H) 12/27/2022   Lab Results  Component Value Date   ALT 165 (H) 12/27/2022   AST 184 (H)  12/27/2022   ALKPHOS 143 (H) 12/27/2022   BILITOT 1.6 (H) 12/27/2022   No results for input(s): "APTT", "INR", "PTT" in the last 168 hours.  MRCP 12/27/2022: IMPRESSION: 1. Intra and extrahepatic biliary duct dilatation with 2 stones in the distal common bile duct, the larger measuring 4 mm and the more distal smaller stone measuring 3 mm. 2. Edema in and around the pancreatic head and pancreatico duodenal groove with edema in the porta hepatis. Imaging features may be secondary to the biliary disease although they do raise concern for acute pancreatitis. 3. 2.0 x 1.6 cm nodular focus of confluent soft tissue in the tail of the pancreas follows splenic signal characteristics and is similar in size and appearance to a CT scan from 11/20/2006. As such, this is felt to be a splenule, potentially of intrapancreatic location. 4. Right nephrectomy.  CT abd/pelvis with contrast 12/27/2022: IMPRESSION: 1. Moderate to marked severity intrahepatic and extrahepatic biliary dilatation, likely secondary to prior cholecystectomy. 2. Evidence of prior right nephrectomy. 3. Colonic diverticulosis. 4. Multiple calcified uterine fibroids. 5. Aortic atherosclerosis.   Aortic Atherosclerosis (ICD10-I70.0).    Assessment/Plan:  67 y/o AA female with a PMH of HTN, obesity, OA, s/p right nephrectomy, and s/p previous cholecystectomy 11/2010 presented to the [facility] ED overnight for 2-day history of nausea, vomiting, and generalized abdominal pain found to have choledocholithiasis. GI consulted for further evaluation and management.   Choledocholithiasis - MRCP with evidence of intra and extrahepatic biliary duct dilatation with 2 stones in distal CBD (4 mm and 3 mm)  Acute pancreatitis - 2/2 #1  UTI  Obesity (BMI 38)  Recommendations:  - Case discussed with Dr. Servando Snare here at [facility] and he does not have availability to perform ERCP today. Recommend transfer to tertiary care center for ERCP. Patient  has been accepted to [facility] or [facility] depending on bed availability.  - Anticipate transfer to [facility] or [facility] this afternoon for ERCP - Continue supportive care per primary team with pain control, antiemetics, and IV fluid hydration - NPO status - No clinical evidence of cholangitis at this time - Repeat serial abdominal examinations - GI will sign off at this time and follow peripherally pending transfer   Case reviewed with Dr. Norma Fredrickson and he agrees with above plan of care.   Thank you for the consult. Please call with questions or concerns.  Gilda Crease, PA-C [facility] [phone]

## 2022-12-27 NOTE — ED Notes (Addendum)
Pt returned to bed, pt informed to call daughter Lynden Ang [phone] to give update.

## 2022-12-27 NOTE — ED Triage Notes (Signed)
EMS brings pt in from home for c/o abd pain accomp by N/V since Sunday after eating at [employer]

## 2022-12-27 NOTE — ED Notes (Signed)
Pt given phone to call grandson Franky Macho [phone]. Phone dialed by this RN.

## 2022-12-28 DIAGNOSIS — K8050 Calculus of bile duct without cholangitis or cholecystitis without obstruction: Secondary | ICD-10-CM | POA: Diagnosis not present

## 2022-12-28 LAB — HIV ANTIBODY (ROUTINE TESTING W REFLEX): HIV Screen 4th Generation wRfx: NONREACTIVE

## 2022-12-28 LAB — COMPREHENSIVE METABOLIC PANEL
ALT: 115 U/L — ABNORMAL HIGH (ref 0–44)
AST: 65 U/L — ABNORMAL HIGH (ref 15–41)
Albumin: 3.3 g/dL — ABNORMAL LOW (ref 3.5–5.0)
Alkaline Phosphatase: 108 U/L (ref 38–126)
Anion gap: 10 (ref 5–15)
BUN: 13 mg/dL (ref 8–23)
CO2: 21 mmol/L — ABNORMAL LOW (ref 22–32)
Calcium: 8.5 mg/dL — ABNORMAL LOW (ref 8.9–10.3)
Chloride: 104 mmol/L (ref 98–111)
Creatinine, Ser: 0.85 mg/dL (ref 0.44–1.00)
GFR, Estimated: >60 mL/min (ref >60)
Glucose, Bld: 82 mg/dL (ref 70–99)
Potassium: 3.6 mmol/L (ref 3.5–5.1)
Sodium: 135 mmol/L (ref 135–145)
Total Bilirubin: 0.8 mg/dL (ref 0.3–1.2)
Total Protein: 7.4 g/dL (ref 6.5–8.1)

## 2022-12-28 LAB — URINE CULTURE: Culture: NO GROWTH

## 2022-12-28 LAB — CBC
HCT: 33.7 % — ABNORMAL LOW (ref 36.0–46.0)
Hemoglobin: 10.4 g/dL — ABNORMAL LOW (ref 12.0–15.0)
MCH: 29.9 pg (ref 26.0–34.0)
MCHC: 30.9 g/dL (ref 30.0–36.0)
MCV: 96.8 fL (ref 80.0–100.0)
Platelets: 274 10*3/uL (ref 150–400)
RBC: 3.48 MIL/uL — ABNORMAL LOW (ref 3.87–5.11)
RDW: 13.6 % (ref 11.5–15.5)
WBC: 8.3 10*3/uL (ref 4.0–10.5)
nRBC: 0 % (ref 0.0–0.2)

## 2022-12-28 MED ORDER — CARMEX CLASSIC LIP BALM EX OINT
1.0000 | TOPICAL_OINTMENT | CUTANEOUS | Status: DC | PRN
  Filled 2022-12-28: qty 10

## 2022-12-28 MED ORDER — KCL-LACTATED RINGERS-D5W 20 MEQ/L IV SOLN
INTRAVENOUS | Status: AC
  Filled 2022-12-28 (×2): qty 1000

## 2022-12-28 MED ORDER — AMLODIPINE BESYLATE 5 MG PO TABS
5.0000 mg | ORAL_TABLET | Freq: Every day | ORAL | Status: DC
  Administered 2022-12-28 – 2022-12-30 (×3): 5 mg via ORAL
  Filled 2022-12-28 (×3): qty 1

## 2022-12-28 NOTE — Plan of Care (Signed)
  Problem: Education: Goal: Knowledge of General Education information will improve Description: Including pain rating scale, medication(s)/side effects and non-pharmacologic comfort measures Outcome: Progressing   Problem: Health Behavior/Discharge Planning: Goal: Ability to manage health-related needs will improve Outcome: Progressing   Problem: Clinical Measurements: Goal: Will remain free from infection Outcome: Progressing   

## 2022-12-28 NOTE — Progress Notes (Signed)
[facility] PROGRESS NOTE  Kathleen Morgan (DOB: Aug 20, 1955) ZOX:[id number] PCP: [facility]  Brief Narrative: Kathleen Morgan is a 67 y.o. female with a history of HTN, GERD, cholecystectomy who presented to [facility] ED on 12/27/2022 with epigastric pain radiating to the back, vomiting for 3 days. She was found to have choledocholithiasis confirmed by MRCP with obstruction (bilirubin 1.6) prompting admission to [facility] where ERCP is available, planned per GI on 10/18.   Subjective: Abdominal pain and nausea improved with medications.   Objective: BP (!) 144/65 (BP Location: Left Arm)   Pulse 84   Temp 97.7 F (36.5 C) (Oral)   Resp 18   Ht 5\' 4"  (1.626 m)   Wt 104.1 kg   SpO2 97%   BMI 39.39 kg/m   Gen: No distress Pulm: Clear, nonlabored  CV: RRR, no MRG or edema GI: Soft, +epigastric tenderness without rebound or distention, +BS Neuro: Alert and oriented. No new focal deficits. Ext: Warm, no deformities. Skin: No jaundice, rashes, lesions or ulcers on visualized skin   UA: Dirty catch.  Assessment & Plan: Obstructive choledocholithiasis: Confirmed ductal dilatation and cholestatic enzyme elevation.  - GI consulted, planning ERCP 10/18 with Dr. Marca Ancona. - Will monitor LFTs   Gallstone pancreatitis:  - NPO, IVF (change to LR, decrease rate to conserve fluid), IV antiemetics and analgesics.   HTN:  - Replace ACEi with norvasc given her AKI. - IV hydralazine prn  GERD:  - IV PPI  Normocytic anemia: Monitor.  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  - Avoid nephrotoxins, NSAIDs - Hold lisinopril for today. Replace with norvasc.  Tyrone Nine, MD [facility] [email or web address] 12/28/2022, 10:51 AM

## 2022-12-28 NOTE — Progress Notes (Signed)
[facility] Progress Note  SUBJECTIVE:   Interval history: Kathleen Morgan was seen and evaluated today at bedside. Resting comfortably in chair. Noted that she ate food today, no nausea or vomiting. Little abdominal pain in epigastric region. No chest pain or shortness of breath. No recent bowel movement. Liver enzyme trend noted and encouraging.  Past Medical History:  Diagnosis Date   Arthritis    Carpal tunnel syndrome    Heart palpitations    Hypertension    Past Surgical History:  Procedure Laterality Date   BACK SURGERY     CESAREAN SECTION     HERNIA REPAIR     NEPHRECTOMY     Current Facility-Administered Medications  Medication Dose Route Frequency Provider Last Rate Last Admin   acetaminophen (TYLENOL) tablet 650 mg  650 mg Oral Q4H PRN Anthoney Harada, NP       albuterol (PROVENTIL) (2.5 MG/3ML) 0.083% nebulizer solution 2.5 mg  2.5 mg Nebulization Q2H PRN Kirby Crigler, Mir M, MD       amLODipine (NORVASC) tablet 5 mg  5 mg Oral Daily Hazeline Junker B, MD   5 mg at 12/28/22 1249   dextrose 5% in lactated ringers with KCl 20 mEq/L infusion   Intravenous Continuous Tyrone Nine, MD 100 mL/hr at 12/28/22 1242 New Bag at 12/28/22 1242   enoxaparin (LOVENOX) injection 40 mg  40 mg Subcutaneous q1800 Kirby Crigler, Mir M, MD   40 mg at 12/27/22 1801   hydrALAZINE (APRESOLINE) injection 5 mg  5 mg Intravenous Q6H PRN Kirby Crigler, Mir M, MD       HYDROmorphone (DILAUDID) injection 1 mg  1 mg Intravenous Q3H PRN Kirby Crigler, Mir M, MD   1 mg at 12/28/22 0549   influenza vaccine adjuvanted (FLUAD) injection 0.5 mL  0.5 mL Intramuscular Tomorrow-1000 Bobette Mo, MD       lip balm (CARMEX) ointment 1 Application  1 Application Topical PRN Kirby Crigler, Mir M, MD       ondansetron [facility]) injection 4 mg  4 mg Intravenous Q6H PRN Kirby Crigler, Mir M, MD       pantoprazole (PROTONIX) injection 40 mg  40 mg Intravenous Q24H Kirby Crigler, Mir M, MD   40 mg at 12/27/22 1801    Allergies as of 12/27/2022   (No Known Allergies)   Review of Systems:  Review of Systems  Respiratory:  Negative for shortness of breath.   Cardiovascular:  Negative for chest pain.  Gastrointestinal:  Positive for abdominal pain. Negative for nausea and vomiting.    OBJECTIVE:   Temp:  [97.7 F (36.5 C)-98.6 F (37 C)] 98.6 F (37 C) (10/17 1400) Pulse Rate:  [63-84] 84 (10/17 1400) Resp:  [15-18] 16 (10/17 1400) BP: (118-165)/(65-82) 165/76 (10/17 1400) SpO2:  [93 %-100 %] 98 % (10/17 1400) Weight:  [104.1 kg] 104.1 kg (10/16 1603) Last BM Date : 12/24/22 (pta) Physical Exam Constitutional:      General: She is not in acute distress.    Appearance: She is not ill-appearing, toxic-appearing or diaphoretic.  Cardiovascular:     Rate and Rhythm: Normal rate and regular rhythm.  Pulmonary:     Effort: No respiratory distress.     Breath sounds: Normal breath sounds.  Abdominal:     General: Bowel sounds are normal. There is no distension.     Palpations: Abdomen is soft.     Tenderness: There is abdominal tenderness (epigastric). There is no guarding.  Neurological:     Mental Status: She is  alert.     Labs: Recent Labs    12/27/22 0115 12/28/22 0719  WBC 13.9* 8.3  HGB 11.8* 10.4*  HCT 36.5 33.7*  PLT 297 274   BMET Recent Labs    12/27/22 0115 12/28/22 0719  NA 135 135  K 3.9 3.6  CL 101 104  CO2 24 21*  GLUCOSE 158* 82  BUN 19 13  CREATININE 1.18* 0.85  CALCIUM 9.1 8.5*   LFT Recent Labs    12/28/22 0719  PROT 7.4  ALBUMIN 3.3*  AST 65*  ALT 115*  ALKPHOS 108  BILITOT 0.8   PT/INR No results for input(s): "LABPROT", "INR" in the last 72 hours. Diagnostic imaging: MR ABDOMEN MRCP W WO CONTAST  Result Date: 12/27/2022 CLINICAL DATA:  Nausea and vomiting. Biliary dilatation on previous CT. EXAM: MRI ABDOMEN WITHOUT AND WITH CONTRAST (INCLUDING MRCP) TECHNIQUE: Multiplanar multisequence MR imaging of the abdomen was performed both  before and after the administration of intravenous contrast. Heavily T2-weighted images of the biliary and pancreatic ducts were obtained, and three-dimensional MRCP images were rendered by post processing. CONTRAST:  10mL GADAVIST GADOBUTROL 1 MMOL/ML IV SOLN COMPARISON:  CT scan from earlier the same day. FINDINGS: Lower chest: Dependent atelectasis.  Acute findings Hepatobiliary: No focal mass lesion evident within the liver. No focal restricted diffusion. Intra and extrahepatic biliary duct dilatation noted with common duct dilated up to 13 mm. Common bile duct in the head of the pancreas measures 11 mm just proximal to the ampulla. 2 stones are seen in the distal common bile duct, the larger measuring approximately 4 mm in the more distal smaller stone measuring about 3 mm (see 3D MRCP image 67 of series 5). Pancreas: There is some atrophy of the pancreatic parenchyma with prominence of the main pancreatic duct in the head of the pancreas measuring up to 5 mm diameter. There is some edema in and around the pancreatic head and the pancreatico duodenal groove with edema noted in the porta hepatis. 2.0 x 1.6 cm nodular focus of confluent soft tissue is identified in the tail of the pancreas with the confluence of the soft tissue at upper portion to the remaining background pancreatic parenchyma. This nodule follows splenic signal characteristics and is similar in size and appearance to a CT scan from 11/20/2006. As such, this is felt to be a splenule, potentially of intrapancreatic location. Spleen: No splenomegaly. No suspicious focal mass lesion. See above. Adrenals/Urinary Tract: No adrenal nodule or mass. Right nephrectomy. Scattered tiny foci of non enhancement are seen in the left kidney, some of which demonstrate intrinsic T1 shortening on precontrast images. Although these are technically too small to characterize, imaging features are most suggestive of benign Bosniak I and II cysts requiring no specific  further imaging follow-up. Stomach/Bowel: Stomach is unremarkable. No gastric wall thickening. No evidence of outlet obstruction. Duodenum is normally positioned as is the ligament of Treitz. There is some Peri duodenal edema. No small bowel or colonic dilatation within the visualized abdomen. Vascular/Lymphatic: No abdominal aortic aneurysm. Portal vein, superior mesenteric vein, and splenic vein are patent. No abdominal lymphadenopathy Other:  No substantial ascites. Musculoskeletal: No focal suspicious marrow enhancement within the visualized bony anatomy. IMPRESSION: 1. Intra and extrahepatic biliary duct dilatation with 2 stones in the distal common bile duct, the larger measuring 4 mm and the more distal smaller stone measuring 3 mm. 2. Edema in and around the pancreatic head and pancreatico duodenal groove with edema in the porta hepatis.  Imaging features may be secondary to the biliary disease although they do raise concern for acute pancreatitis. 3. 2.0 x 1.6 cm nodular focus of confluent soft tissue in the tail of the pancreas follows splenic signal characteristics and is similar in size and appearance to a CT scan from 11/20/2006. As such, this is felt to be a splenule, potentially of intrapancreatic location. 4. Right nephrectomy. Electronically Signed   By: Kennith Center M.D.   On: 12/27/2022 07:03   MR 3D Recon At Scanner  Result Date: 12/27/2022 CLINICAL DATA:  Nausea and vomiting. Biliary dilatation on previous CT. EXAM: MRI ABDOMEN WITHOUT AND WITH CONTRAST (INCLUDING MRCP) TECHNIQUE: Multiplanar multisequence MR imaging of the abdomen was performed both before and after the administration of intravenous contrast. Heavily T2-weighted images of the biliary and pancreatic ducts were obtained, and three-dimensional MRCP images were rendered by post processing. CONTRAST:  10mL GADAVIST GADOBUTROL 1 MMOL/ML IV SOLN COMPARISON:  CT scan from earlier the same day. FINDINGS: Lower chest: Dependent  atelectasis.  Acute findings Hepatobiliary: No focal mass lesion evident within the liver. No focal restricted diffusion. Intra and extrahepatic biliary duct dilatation noted with common duct dilated up to 13 mm. Common bile duct in the head of the pancreas measures 11 mm just proximal to the ampulla. 2 stones are seen in the distal common bile duct, the larger measuring approximately 4 mm in the more distal smaller stone measuring about 3 mm (see 3D MRCP image 67 of series 5). Pancreas: There is some atrophy of the pancreatic parenchyma with prominence of the main pancreatic duct in the head of the pancreas measuring up to 5 mm diameter. There is some edema in and around the pancreatic head and the pancreatico duodenal groove with edema noted in the porta hepatis. 2.0 x 1.6 cm nodular focus of confluent soft tissue is identified in the tail of the pancreas with the confluence of the soft tissue at upper portion to the remaining background pancreatic parenchyma. This nodule follows splenic signal characteristics and is similar in size and appearance to a CT scan from 11/20/2006. As such, this is felt to be a splenule, potentially of intrapancreatic location. Spleen: No splenomegaly. No suspicious focal mass lesion. See above. Adrenals/Urinary Tract: No adrenal nodule or mass. Right nephrectomy. Scattered tiny foci of non enhancement are seen in the left kidney, some of which demonstrate intrinsic T1 shortening on precontrast images. Although these are technically too small to characterize, imaging features are most suggestive of benign Bosniak I and II cysts requiring no specific further imaging follow-up. Stomach/Bowel: Stomach is unremarkable. No gastric wall thickening. No evidence of outlet obstruction. Duodenum is normally positioned as is the ligament of Treitz. There is some Peri duodenal edema. No small bowel or colonic dilatation within the visualized abdomen. Vascular/Lymphatic: No abdominal aortic  aneurysm. Portal vein, superior mesenteric vein, and splenic vein are patent. No abdominal lymphadenopathy Other:  No substantial ascites. Musculoskeletal: No focal suspicious marrow enhancement within the visualized bony anatomy. IMPRESSION: 1. Intra and extrahepatic biliary duct dilatation with 2 stones in the distal common bile duct, the larger measuring 4 mm and the more distal smaller stone measuring 3 mm. 2. Edema in and around the pancreatic head and pancreatico duodenal groove with edema in the porta hepatis. Imaging features may be secondary to the biliary disease although they do raise concern for acute pancreatitis. 3. 2.0 x 1.6 cm nodular focus of confluent soft tissue in the tail of the pancreas follows splenic signal  characteristics and is similar in size and appearance to a CT scan from 11/20/2006. As such, this is felt to be a splenule, potentially of intrapancreatic location. 4. Right nephrectomy. Electronically Signed   By: Kennith Center M.D.   On: 12/27/2022 07:03   CT ABDOMEN PELVIS W CONTRAST  Result Date: 12/27/2022 CLINICAL DATA:  Abdominal pain with nausea and vomiting. EXAM: CT ABDOMEN AND PELVIS WITH CONTRAST TECHNIQUE: Multidetector CT imaging of the abdomen and pelvis was performed using the standard protocol following bolus administration of intravenous contrast. RADIATION DOSE REDUCTION: This exam was performed according to the departmental dose-optimization program which includes automated exposure control, adjustment of the mA and/or kV according to patient size and/or use of iterative reconstruction technique. CONTRAST:  OMNIPAQUE IOHEXOL 350 MG/ML SOLN COMPARISON:  October 24, 2022 FINDINGS: Lower chest: No acute abnormality. Hepatobiliary: No focal liver abnormality is seen. Status post cholecystectomy. Moderate to marked severity intrahepatic and extrahepatic biliary dilatation is seen. The common bile duct measures 13 mm in diameter. Pancreas: Unremarkable. No  pancreatic ductal dilatation or surrounding inflammatory changes. Spleen: Normal in size without focal abnormality. Adrenals/Urinary Tract: Adrenal glands are unremarkable. The right kidney is surgically absent. The left kidney is normal, without renal calculi, focal lesion, or hydronephrosis. Bladder is unremarkable. Stomach/Bowel: Stomach is within normal limits. Appendix appears normal. No evidence of bowel wall thickening, distention, or inflammatory changes. Noninflamed diverticula are seen scattered throughout the large bowel. Vascular/Lymphatic: Aortic atherosclerosis. No enlarged abdominal or pelvic lymph nodes. Reproductive: Multiple calcified uterine fibroids are seen. The bilateral adnexa are unremarkable. Other: No abdominal wall hernia or abnormality. No abdominopelvic ascites. Musculoskeletal: Multilevel degenerative changes seen throughout the lumbar spine with postoperative changes noted at the level of L5. IMPRESSION: 1. Moderate to marked severity intrahepatic and extrahepatic biliary dilatation, likely secondary to prior cholecystectomy. 2. Evidence of prior right nephrectomy. 3. Colonic diverticulosis. 4. Multiple calcified uterine fibroids. 5. Aortic atherosclerosis. Aortic Atherosclerosis (ICD10-I70.0). Electronically Signed   By: Aram Candela M.D.   On: 12/27/2022 03:35    IMPRESSION: Gallstone pancreatitis, improving Choledocholithiasis Transaminase elevation in mixed pattern Epigastric abdominal pain secondary to #1/#2 History cholecystectomy Hypertension  PLAN: -Plan for ERCP 12/29/22 with Dr. Marca Ancona -NPO at midnight for ERCP 12/29/22 -Trend liver enzyme panel -Further recommendations to follow pending procedure   LOS: 1 day   Liliane Shi, DO [facility]

## 2022-12-29 ENCOUNTER — Inpatient Hospital Stay (HOSPITAL_COMMUNITY): Payer: Medicare HMO | Admitting: Anesthesiology

## 2022-12-29 ENCOUNTER — Encounter (HOSPITAL_COMMUNITY)
Admission: AD | Disposition: A | Discharge status: Home / Self Care | Payer: Self-pay | Source: Other Acute Inpatient Hospital | Attending: Family Medicine

## 2022-12-29 ENCOUNTER — Encounter (HOSPITAL_COMMUNITY): Payer: Self-pay | Admitting: Internal Medicine

## 2022-12-29 ENCOUNTER — Inpatient Hospital Stay (HOSPITAL_COMMUNITY): Payer: Medicare HMO

## 2022-12-29 DIAGNOSIS — K8050 Calculus of bile duct without cholangitis or cholecystitis without obstruction: Secondary | ICD-10-CM | POA: Diagnosis not present

## 2022-12-29 HISTORY — PX: BALLOON DILATION: SHX5330

## 2022-12-29 HISTORY — PX: ERCP: SHX5425

## 2022-12-29 HISTORY — PX: SPHINCTEROTOMY: SHX5544

## 2022-12-29 HISTORY — PX: REMOVAL OF STONES: SHX5545

## 2022-12-29 LAB — COMPREHENSIVE METABOLIC PANEL
ALT: 81 U/L — ABNORMAL HIGH (ref 0–44)
AST: 36 U/L (ref 15–41)
Albumin: 3.3 g/dL — ABNORMAL LOW (ref 3.5–5.0)
Alkaline Phosphatase: 101 U/L (ref 38–126)
Anion gap: 9 (ref 5–15)
BUN: 13 mg/dL (ref 8–23)
CO2: 23 mmol/L (ref 22–32)
Calcium: 8.5 mg/dL — ABNORMAL LOW (ref 8.9–10.3)
Chloride: 101 mmol/L (ref 98–111)
Creatinine, Ser: 0.99 mg/dL (ref 0.44–1.00)
GFR, Estimated: >60 mL/min (ref >60)
Glucose, Bld: 134 mg/dL — ABNORMAL HIGH (ref 70–99)
Potassium: 3.7 mmol/L (ref 3.5–5.1)
Sodium: 133 mmol/L — ABNORMAL LOW (ref 135–145)
Total Bilirubin: 0.5 mg/dL (ref 0.3–1.2)
Total Protein: 7.3 g/dL (ref 6.5–8.1)

## 2022-12-29 LAB — CBC
HCT: 31.3 % — ABNORMAL LOW (ref 36.0–46.0)
Hemoglobin: 10.1 g/dL — ABNORMAL LOW (ref 12.0–15.0)
MCH: 30.4 pg (ref 26.0–34.0)
MCHC: 32.3 g/dL (ref 30.0–36.0)
MCV: 94.3 fL (ref 80.0–100.0)
Platelets: 260 10*3/uL (ref 150–400)
RBC: 3.32 MIL/uL — ABNORMAL LOW (ref 3.87–5.11)
RDW: 13.2 % (ref 11.5–15.5)
WBC: 7.5 10*3/uL (ref 4.0–10.5)
nRBC: 0 % (ref 0.0–0.2)

## 2022-12-29 SURGERY — ERCP, WITH INTERVENTION IF INDICATED
Anesthesia: General

## 2022-12-29 MED ORDER — DICLOFENAC SUPPOSITORY 100 MG
RECTAL | Status: AC
  Filled 2022-12-29: qty 1

## 2022-12-29 MED ORDER — DICLOFENAC SUPPOSITORY 100 MG
RECTAL | Status: DC | PRN
Start: 2022-12-29 — End: 2022-12-29
  Administered 2022-12-29: 100 mg via RECTAL

## 2022-12-29 MED ORDER — LACTATED RINGERS IV SOLN
INTRAVENOUS | Status: DC | PRN
Start: 2022-12-29 — End: 2022-12-30

## 2022-12-29 MED ORDER — ROCURONIUM BROMIDE 100 MG/10ML IV SOLN
INTRAVENOUS | Status: DC | PRN
  Administered 2022-12-29: 10 mg via INTRAVENOUS
  Administered 2022-12-29: 50 mg via INTRAVENOUS

## 2022-12-29 MED ORDER — ONDANSETRON HCL 4 MG/2ML IJ SOLN
INTRAMUSCULAR | Status: DC | PRN
  Administered 2022-12-29: 4 mg via INTRAVENOUS

## 2022-12-29 MED ORDER — SODIUM CHLORIDE 0.9 % IV SOLN
INTRAVENOUS | Status: DC | PRN
  Administered 2022-12-29: 25 mL

## 2022-12-29 MED ORDER — SUGAMMADEX SODIUM 200 MG/2ML IV SOLN
INTRAVENOUS | Status: DC | PRN
  Administered 2022-12-29: 200 mg via INTRAVENOUS

## 2022-12-29 MED ORDER — FENTANYL CITRATE (PF) 100 MCG/2ML IJ SOLN
INTRAMUSCULAR | Status: AC
  Filled 2022-12-29: qty 2

## 2022-12-29 MED ORDER — PROPOFOL 10 MG/ML IV BOLUS
INTRAVENOUS | Status: DC | PRN
  Administered 2022-12-29: 50 mg via INTRAVENOUS
  Administered 2022-12-29: 150 mg via INTRAVENOUS

## 2022-12-29 MED ORDER — CIPROFLOXACIN IN D5W 400 MG/200ML IV SOLN
INTRAVENOUS | Status: DC | PRN
Start: 2022-12-29 — End: 2023-01-11
  Administered 2022-12-29: 400 mg via INTRAVENOUS

## 2022-12-29 MED ORDER — PHENYLEPHRINE HCL (PRESSORS) 10 MG/ML IV SOLN
INTRAVENOUS | Status: DC | PRN
Start: 2022-12-29 — End: 2023-01-11
  Administered 2022-12-29 (×2): 80 ug via INTRAVENOUS
  Administered 2022-12-29: 160 ug via INTRAVENOUS
  Administered 2022-12-29: 80 ug via INTRAVENOUS
  Administered 2022-12-29: 100 ug via INTRAVENOUS
  Administered 2022-12-29: 160 ug via INTRAVENOUS

## 2022-12-29 MED ORDER — MIDAZOLAM HCL 2 MG/2ML IJ SOLN
INTRAMUSCULAR | Status: AC
  Filled 2022-12-29: qty 2

## 2022-12-29 MED ORDER — GLUCAGON HCL RDNA (DIAGNOSTIC) 1 MG IJ SOLR
INTRAMUSCULAR | Status: AC
  Filled 2022-12-29: qty 2

## 2022-12-29 MED ORDER — MIDAZOLAM HCL 5 MG/5ML IJ SOLN
INTRAMUSCULAR | Status: DC | PRN
  Administered 2022-12-29 (×2): 1 mg via INTRAVENOUS

## 2022-12-29 MED ORDER — PROPOFOL 10 MG/ML IV BOLUS
INTRAVENOUS | Status: AC
  Filled 2022-12-29: qty 20

## 2022-12-29 MED ORDER — LIDOCAINE HCL (CARDIAC) PF 100 MG/5ML IV SOSY
PREFILLED_SYRINGE | INTRAVENOUS | Status: DC | PRN
  Administered 2022-12-29: 50 mg via INTRAVENOUS

## 2022-12-29 MED ORDER — FENTANYL CITRATE (PF) 100 MCG/2ML IJ SOLN
INTRAMUSCULAR | Status: DC | PRN
  Administered 2022-12-29 (×3): 50 ug via INTRAVENOUS

## 2022-12-29 MED ORDER — ESMOLOL HCL 100 MG/10ML IV SOLN
INTRAVENOUS | Status: DC | PRN
Start: 2022-12-29 — End: 2023-01-11
  Administered 2022-12-29: 10 mg via INTRAVENOUS
  Administered 2022-12-29: 20 mg via INTRAVENOUS

## 2022-12-29 MED ORDER — CIPROFLOXACIN IN D5W 400 MG/200ML IV SOLN
INTRAVENOUS | Status: AC
  Filled 2022-12-29: qty 200

## 2022-12-29 NOTE — Plan of Care (Signed)
[facility] Tonsillectomy/Adenoidectomy, Postoperative PEDS care plan entered in error.

## 2022-12-29 NOTE — Op Note (Signed)
[facility] Patient Name: Kathleen Morgan Procedure Date: 12/29/2022 MRN: [id number] Attending MD: Vida Rigger , MD, [id number] Date of Birth: [birth year] CSN: [id number] Age: 67 Admit Type: Inpatient Procedure:                ERCP Indications:              Bile duct stone(s) on MRCP, Elevated liver enzymes,                            Acute gallstone pancreatitis Providers:                Vida Rigger, MD, Jacquelyn "Jaci" Clelia Croft, RN, Harrington Challenger, Technician Referring MD:             Alben Spittle. Funke Medicines:                General Anesthesia Complications:            No immediate complications. Estimated Blood Loss:     Estimated blood loss: none. Procedure:                Pre-Anesthesia Assessment:                           - Prior to the procedure, a History and Physical                            was performed, and patient medications and                            allergies were reviewed. The patient's tolerance of                            previous anesthesia was also reviewed. The risks                            and benefits of the procedure and the sedation                            options and risks were discussed with the patient.                            All questions were answered, and informed consent                            was obtained. Prior Anticoagulants: The patient has                            taken Lovenox (enoxaparin), last dose was 1 day                            prior to procedure. ASA Grade Assessment: III - A  patient with severe systemic disease. After                            reviewing the risks and benefits, the patient was                            deemed in satisfactory condition to undergo the                            procedure.                           After obtaining informed consent, the scope was                            passed under direct vision. Throughout the                             procedure, the patient's blood pressure, pulse, and                            oxygen saturations were monitored continuously. The                            [id number]-Q190V ([id number]) Olympus duodenoscope was                            introduced through the mouth, and used to inject                            contrast into and used to cannulate the bile duct.                            The ERCP was technically difficult and complex due                            to challenging cannulation. Successful completion                            of the procedure was aided by performing the                            maneuvers documented (below) in this report. The                            patient tolerated the procedure well. Scope In: Scope Out: Findings:      The major papilla was congested. We were initially unable to cannulate       but the wire did not go towards the pancreas but would not advance       despite adequate position so we proceeded with A biliary pre-cut       sphincterotomy was made with a Hydratome sphincterotome using ERBE       electrocautery. There was no post-sphincterotomy bleeding. We then tried  to cannulate a bit longer with the regular sphincterotome and wire and       unfortunately caused a false channel and we switched to the smaller       sphincterotome and wire and enlarged the precut sphincterotomy but were       not able to cannulate the CBD and after a prolonged effort proceeded       with a A biliary pre-cut sphincterotomy was made with a needle knife       using a freehand technique using ERBE electrocautery. There was no       post-sphincterotomy bleeding. Which we were able to get deep selective       cannulation and dye was injected which confirmed the dilated system and       the biliary sphincterotomy was extended with a Hydratome sphincterotome       using ERBE electrocautery. There was no post-sphincterotomy bleeding. To        discover objects, the biliary tree was swept with an adjustable 9- 12 mm       balloon starting at the bifurcation. Sludge was swept from the duct.       Unfortunately we could not get the 9 mm balloon through the       sphincterotomy site and we elected to extend the sphincterotomy site       distally and the biliary sphincterotomy was extended with a needle knife       using a freehand technique using ERBE electrocautery. There was no       post-sphincterotomy bleeding. The biliary tree was swept with the       adjustable balloon starting at the bifurcation. Sludge was swept from       the duct and we were at this point with only minimal resistance able to       get the 9 mm balloon through the patent sphincterotomy site so we       proceeded with Dilation of the common bile duct with a 4 cm by 8 mm       balloon dilator which was successful. The biliary tree was subsequently       swept with a 12 mm balloon starting at the bifurcation. Sludge was swept       from the duct. All stones were removed. The 12 mm balloon at this point       passed readily through the patent sphincterotomy site and after a few       stones were removed nothing was found on subsequent balloon       pull-through's and no abnormality was seen on occlusion cholangiogram       done in the customary fashion and there were no pancreatic injection or       wire advancement throughout the procedure and there was adequate biliary       drainage at the end of the procedure. Impression:               - The major papilla appeared congested.                           - Choledocholithiasis was found. Complete removal                            was accomplished by biliary sphincterotomy and  balloon extraction.                           - A precut conventional biliary sphincterotomy was                            performed.                           - A precut needle-knife biliary sphincterotomy was                             performed.                           - A conventional and needle-knife biliary                            sphincterotomy was performed to enlarge the                            sphincterotomy site in both distally and proximally.                           - The biliary tree was swept multiple times and                            initially sludge was found.                           - A biliary sphincterotomy was performed and                            extended as above.                           - The biliary tree was swept and sludge was found.                           - Common bile duct was successfully dilated.                           - The biliary tree was swept after dilation and the                            stones were removed and nothing was found at the                            end of the procedure. Moderate Sedation:      Not Applicable - Patient had care per Anesthesia. Recommendation:           - Clear liquid diet today. May slowly advance                            tomorrow if doing okay                           -  Continue present medications.                           - Return to GI clinic PRN.                           - Telephone GI clinic if symptomatic PRN. Procedure Code(s):        --- Professional ---                           [phone], 59, Endoscopic retrograde                            cholangiopancreatography (ERCP); with                            trans-endoscopic balloon dilation of                            biliary/pancreatic duct(s) or of ampulla                            (sphincteroplasty), including sphincterotomy, when                            performed, each duct                           43264, Endoscopic retrograde                            cholangiopancreatography (ERCP); with removal of                            calculi/debris from biliary/pancreatic duct(s) Diagnosis Code(s):        --- Professional ---                            K80.50, Calculus of bile duct without cholangitis                            or cholecystitis without obstruction                           R74.8, Abnormal levels of other serum enzymes                           K85.90, Acute pancreatitis without necrosis or                            infection, unspecified                           K83.8, Other specified diseases of biliary tract CPT copyright 2022 American Medical Association. All rights reserved. The codes documented in this report are preliminary and upon coder review may  be revised to meet current compliance requirements. Vida Rigger, MD 12/29/2022 3:36:29 PM This report has been  signed electronically. Number of Addenda: 0

## 2022-12-29 NOTE — Progress Notes (Signed)
Kathleen Morgan 1:15 PM  Subjective: Patient seen and examined in her hospital computer chart reviewed and her case discussed with my partner Dr. Lorenso Quarry and her belly pain is little better than it was and she is mostly worried about her heart medicine and reassurance was given about her monitor and she is not on blood thinners at home and has no other complaints  Objective: Vital signs stable afebrile no acute distress exam please see preassessment evaluation exam pertinent for a little midepigastric discomfort no guarding or rebound white count okay liver test decreased MRCP reviewed  Assessment: CBD stones  Plan: We rediscussed the risk benefits methods and success rate of ERCP and we will proceed today with further workup and plans pending those findings  [facility]  office [phone] After 5PM or if no answer call [phone]

## 2022-12-29 NOTE — Progress Notes (Signed)
[facility] PROGRESS NOTE  Kathleen Morgan (DOB: September 09, 1955) FIE:[id number] PCP: [facility]  Brief Narrative: Kathleen Morgan is a 67 y.o. female with a history of HTN, GERD, cholecystectomy who presented to [facility] ED on 12/27/2022 with epigastric pain radiating to the back, vomiting for 3 days. She was found to have choledocholithiasis confirmed by MRCP with obstruction (bilirubin 1.6) prompting admission to [facility] where ERCP is available, planned per GI on 10/18.   12/29/2022: Patient seen.  Patient underwent ERCP today.  ERCP revealed choledocholithiasis, complete removal was accomplished by biliary sphincterectomy and balloon extraction.  No new complaints today.  Urine culture did not grow any organisms.  Labs reviewed today: Chemistry reveals sodium of 133, potassium of 3.7, CO2 of 23, blood sugar 134, total bili of 0.5.  CBC revealed WBC of 7.5, hemoglobin of 10.1 and hematocrit of 31.3 with platelet count of 260.  Subjective: No new complaints. No fever or chills. No nausea or vomiting.    Objective: Gen: Patient is morbidly obese.  Not in any distress.  Awake and alert.  Dry buccal mucosa. HEENT: Patient is pale.  No jaundice. Neck: Supple. Lungs: Clear to auscultation. CVS: S1-S2. Abdomen: Distended.  Bowel sounds are present. Neuro: Awake and alert.  Moves all extremities. Extremities: No leg edema.  Assessment & Plan: Obstructive choledocholithiasis:  -Confirmed ductal dilatation and cholestatic enzyme elevation.  - GI performed ERCP today (see above documentation). -Further care as per GI team.    Elevated lipase: -CT scan is nonrevealing. -Rule out possible gallstone pancreatitis:  -GI team is highly appreciated.    HTN:  - Replaced ACEi with norvasc given her AKI. -Continue to monitor closely.  Patient is currently on amlodipine 5 Mg p.o. once daily. -IV hydralazine prn  GERD:  - IV PPI  Normocytic anemia: -Monitor.  AKI: -AKI  has resolved. -Likely volume related. -Continue to monitor closely. -Avoid nephrotoxins.  Barnetta Chapel, MD [facility] [email or web address] 12/29/2022, 5:30 PM

## 2022-12-29 NOTE — Anesthesia Procedure Notes (Signed)
Procedure Name: Intubation Date/Time: 12/29/2022 1:35 PM  Performed by: Garth Bigness, CRNAPre-anesthesia Checklist: Patient identified, Emergency Drugs available, Suction available and Patient being monitored Patient Re-evaluated:Patient Re-evaluated prior to induction Oxygen Delivery Method: Circle system utilized Preoxygenation: Pre-oxygenation with 100% oxygen Induction Type: IV induction Ventilation: Mask ventilation without difficulty Laryngoscope Size: Mac and 3 Grade View: Grade II Tube type: Oral Number of attempts: 1 Airway Equipment and Method: Stylet and Oral airway Placement Confirmation: ETT inserted through vocal cords under direct vision, positive ETCO2 and breath sounds checked- equal and bilateral Secured at: 23 cm Tube secured with: Tape Dental Injury: Teeth and Oropharynx as per pre-operative assessment

## 2022-12-29 NOTE — Anesthesia Preprocedure Evaluation (Addendum)
Anesthesia Evaluation  Patient identified by MRN, date of birth, ID band Patient awake    Reviewed: Allergy & Precautions, H&P , NPO status , Patient's Chart, lab work & pertinent test results  Airway Mallampati: II  TM Distance: >3 FB Neck ROM: Full    Dental  (+) Poor Dentition, Partial Upper   Pulmonary neg pulmonary ROS   Pulmonary exam normal breath sounds clear to auscultation       Cardiovascular hypertension, Pt. on medications Normal cardiovascular exam Rhythm:Regular Rate:Normal     Neuro/Psych negative neurological ROS  negative psych ROS   GI/Hepatic negative GI ROS, Neg liver ROS,,,  Endo/Other  negative endocrine ROS    Renal/GU negative Renal ROS  negative genitourinary   Musculoskeletal  (+) Arthritis , Osteoarthritis,    Abdominal  (+) + obese  Peds negative pediatric ROS (+)  Hematology negative hematology ROS (+)   Anesthesia Other Findings   Reproductive/Obstetrics negative OB ROS                             Anesthesia Physical Anesthesia Plan  ASA: 2  Anesthesia Plan: General   Post-op Pain Management:    Induction: Intravenous  PONV Risk Score and Plan: 3 and Ondansetron, Dexamethasone, Midazolam and Treatment may vary due to age or medical condition  Airway Management Planned: Oral ETT  Additional Equipment:   Intra-op Plan:   Post-operative Plan: Extubation in OR  Informed Consent: I have reviewed the patients History and Physical, chart, labs and discussed the procedure including the risks, benefits and alternatives for the proposed anesthesia with the patient or authorized representative who has indicated his/her understanding and acceptance.     Dental advisory given  Plan Discussed with: CRNA  Anesthesia Plan Comments:        Anesthesia Quick Evaluation

## 2022-12-30 DIAGNOSIS — K8050 Calculus of bile duct without cholangitis or cholecystitis without obstruction: Secondary | ICD-10-CM | POA: Diagnosis not present

## 2022-12-30 LAB — COMPREHENSIVE METABOLIC PANEL
ALT: 100 U/L — ABNORMAL HIGH (ref 0–44)
AST: 92 U/L — ABNORMAL HIGH (ref 15–41)
Albumin: 2.9 g/dL — ABNORMAL LOW (ref 3.5–5.0)
Alkaline Phosphatase: 178 U/L — ABNORMAL HIGH (ref 38–126)
Anion gap: 8 (ref 5–15)
BUN: 10 mg/dL (ref 8–23)
CO2: 26 mmol/L (ref 22–32)
Calcium: 8.7 mg/dL — ABNORMAL LOW (ref 8.9–10.3)
Chloride: 103 mmol/L (ref 98–111)
Creatinine, Ser: 1.21 mg/dL — ABNORMAL HIGH (ref 0.44–1.00)
GFR, Estimated: 49 mL/min — ABNORMAL LOW (ref >60)
Glucose, Bld: 91 mg/dL (ref 70–99)
Potassium: 3.8 mmol/L (ref 3.5–5.1)
Sodium: 137 mmol/L (ref 135–145)
Total Bilirubin: 0.6 mg/dL (ref 0.3–1.2)
Total Protein: 6.7 g/dL (ref 6.5–8.1)

## 2022-12-30 LAB — CBC WITH DIFFERENTIAL/PLATELET
Abs Immature Granulocytes: 0.03 K/uL (ref 0.00–0.07)
Basophils Absolute: 0.1 K/uL (ref 0.0–0.1)
Basophils Relative: 1 %
Eosinophils Absolute: 0.1 K/uL (ref 0.0–0.5)
Eosinophils Relative: 2 %
HCT: 29.9 % — ABNORMAL LOW (ref 36.0–46.0)
Hemoglobin: 9.7 g/dL — ABNORMAL LOW (ref 12.0–15.0)
Immature Granulocytes: 0 %
Lymphocytes Relative: 25 %
Lymphs Abs: 1.7 K/uL (ref 0.7–4.0)
MCH: 30.7 pg (ref 26.0–34.0)
MCHC: 32.4 g/dL (ref 30.0–36.0)
MCV: 94.6 fL (ref 80.0–100.0)
Monocytes Absolute: 0.7 K/uL (ref 0.1–1.0)
Monocytes Relative: 10 %
Neutro Abs: 4.4 K/uL (ref 1.7–7.7)
Neutrophils Relative %: 62 %
Platelets: 247 K/uL (ref 150–400)
RBC: 3.16 MIL/uL — ABNORMAL LOW (ref 3.87–5.11)
RDW: 13.2 % (ref 11.5–15.5)
WBC: 7.1 K/uL (ref 4.0–10.5)
nRBC: 0 % (ref 0.0–0.2)

## 2022-12-30 MED ORDER — LISINOPRIL 5 MG PO TABS
5.0000 mg | ORAL_TABLET | Freq: Every day | ORAL | Status: DC
  Administered 2022-12-30: 5 mg via ORAL
  Filled 2022-12-30: qty 1

## 2022-12-30 MED ORDER — SODIUM CHLORIDE 0.9% FLUSH
3.0000 mL | Freq: Two times a day (BID) | INTRAVENOUS | Status: DC
  Administered 2022-12-30 (×2): 3 mL via INTRAVENOUS

## 2022-12-30 MED ORDER — FAMOTIDINE 20 MG PO TABS: 20.0000 mg | ORAL_TABLET | Freq: Every day | ORAL | Status: DC | PRN

## 2022-12-30 MED ORDER — DILTIAZEM HCL ER COATED BEADS 240 MG PO CP24
240.0000 mg | ORAL_CAPSULE | Freq: Two times a day (BID) | ORAL | Status: DC
  Administered 2022-12-30 – 2022-12-31 (×2): 240 mg via ORAL
  Filled 2022-12-30 (×2): qty 1

## 2022-12-30 NOTE — Progress Notes (Signed)
Progress Note   Patient: Kathleen Morgan ZOX:[id number] DOB: 20-Dec-1955 DOA: 12/27/2022     3 DOS: the patient was seen and examined on 12/30/2022        Brief hospital course: Kathleen Morgan is a 67 y.o. F with HTN, hx cholecystectomy who presented with few days epigastric pain, vomiting.  MRCP showed choledocholithiasis, Tbili 1.6.  Transferred to [facility] for ERCP.  Underwent ERCP 10/18      Assessment and Plan: Choledocholithiasis and gallstone pancreatitis ERCP yesterday, considerable manipulation needed for sphincterotomy, LFTs up today, still nauseated, unable to tolerate anything but clears today. - Advance diet as tolerated - Trend LFTs   Hypertension BP elevated - Stop amlodipine - Resume home lisinopril and diltiazem   Morbid obesity BMI 39.4 in setting of comorbid hypertension.         Subjective: Patient unable to eat anything, severe nausea, tired, weak.  Feeling palpitations.     Physical Exam: BP (!) 168/87 (BP Location: Left Arm)   Pulse 87   Temp 98.1 F (36.7 C) (Oral)   Resp 18   Ht 5\' 4"  (1.626 m)   Wt 104.1 kg   SpO2 95%   BMI 39.39 kg/m   Obese adult female, sitting up in recliner, tired and weak RRR, no murmurs, no peripheral edema Respiratory rate normal, lungs clear without rales or wheezes Severe epigastric discomfort, guarding, no distention, obese Attention normal, affect appropriate, judgment and insight appear normal    Data Reviewed: Discussed with gastroenterology Comprehensive metabolic panel shows increased LFTs, total bilirubin normal CBC shows hemoglobin 9.7, no change, white count normal  Family Communication:     Disposition: Status is: Inpatient         Author: Alberteen Sam, MD 12/30/2022 1:53 PM  For on call review [email or web address].

## 2022-12-30 NOTE — Progress Notes (Signed)
Subjective: Patient states she had epigastric abdominal pain and nausea at around 6:30 PM yesterday which resolved with pain medications. She has not had a bowel movement but is passing gas. She is requesting for diet to be advanced.  Objective: Vital signs in last 24 hours: Temp:  [97.4 F (36.3 C)-98.5 F (36.9 C)] 98.3 F (36.8 C) (10/19 0552) Pulse Rate:  [70-91] 70 (10/19 0552) Resp:  [6-23] 17 (10/19 0552) BP: (121-174)/(68-91) 174/91 (10/19 0552) SpO2:  [92 %-100 %] 98 % (10/19 0552) Weight:  [104.1 kg] 104.1 kg (10/18 1223) Weight change:  Last BM Date : 12/24/22 (pta)  PE: Not in distress GENERAL: Nonicteric, mild pallor  ABDOMEN: Mild epigastric tenderness EXTREMITIES: No deformity  Lab Results: Results for orders placed or performed during the hospital encounter of 12/27/22 (from the past 48 hour(s))  CBC     Status: Abnormal   Collection Time: 12/29/22  4:21 AM  Result Value Ref Range   WBC 7.5 4.0 - 10.5 K/uL   RBC 3.32 (L) 3.87 - 5.11 MIL/uL   Hemoglobin 10.1 (L) 12.0 - 15.0 g/dL   HCT 01.0 (L) 27.2 - 53.6 %   MCV 94.3 80.0 - 100.0 fL   MCH 30.4 26.0 - 34.0 pg   MCHC 32.3 30.0 - 36.0 g/dL   RDW 64.4 03.4 - 74.2 %   Platelets 260 150 - 400 K/uL   nRBC 0.0 0.0 - 0.2 %    Comment: Performed at [facility], [address], [place], [place] [address]  Comprehensive metabolic panel     Status: Abnormal   Collection Time: 12/29/22  4:21 AM  Result Value Ref Range   Sodium 133 (L) 135 - 145 mmol/L   Potassium 3.7 3.5 - 5.1 mmol/L   Chloride 101 98 - 111 mmol/L   CO2 23 22 - 32 mmol/L   Glucose, Bld 134 (H) 70 - 99 mg/dL    Comment: Glucose reference range applies only to samples taken after fasting for at least 8 hours.   BUN 13 8 - 23 mg/dL   Creatinine, Ser 8.75 0.44 - 1.00 mg/dL   Calcium 8.5 (L) 8.9 - 10.3 mg/dL   Total Protein 7.3 6.5 - 8.1 g/dL   Albumin 3.3 (L) 3.5 - 5.0 g/dL   AST 36 15 - 41 U/L   ALT 81 (H) 0 - 44 U/L    Alkaline Phosphatase 101 38 - 126 U/L   Total Bilirubin 0.5 0.3 - 1.2 mg/dL   GFR, Estimated >64 >33 mL/min    Comment: (NOTE) Calculated using the CKD-EPI Creatinine Equation (2021)    Anion gap 9 5 - 15    Comment: Performed at [facility], [address] [place], [place] [address]  Comprehensive metabolic panel     Status: Abnormal   Collection Time: 12/30/22  5:06 AM  Result Value Ref Range   Sodium 137 135 - 145 mmol/L   Potassium 3.8 3.5 - 5.1 mmol/L   Chloride 103 98 - 111 mmol/L   CO2 26 22 - 32 mmol/L   Glucose, Bld 91 70 - 99 mg/dL    Comment: Glucose reference range applies only to samples taken after fasting for at least 8 hours.   BUN 10 8 - 23 mg/dL   Creatinine, Ser 8.41 (H) 0.44 - 1.00 mg/dL   Calcium 8.7 (L) 8.9 - 10.3 mg/dL   Total Protein 6.7 6.5 - 8.1 g/dL   Albumin 2.9 (L) 3.5 -  5.0 g/dL   AST 92 (H) 15 - 41 U/L   ALT 100 (H) 0 - 44 U/L   Alkaline Phosphatase 178 (H) 38 - 126 U/L   Total Bilirubin 0.6 0.3 - 1.2 mg/dL   GFR, Estimated 49 (L) >60 mL/min    Comment: (NOTE) Calculated using the CKD-EPI Creatinine Equation (2021)    Anion gap 8 5 - 15    Comment: Performed at [facility], [address], [place], [place] [address]  CBC with Differential/Platelet     Status: Abnormal   Collection Time: 12/30/22  5:06 AM  Result Value Ref Range   WBC 7.1 4.0 - 10.5 K/uL   RBC 3.16 (L) 3.87 - 5.11 MIL/uL   Hemoglobin 9.7 (L) 12.0 - 15.0 g/dL   HCT 60.4 (L) 54.0 - 98.1 %   MCV 94.6 80.0 - 100.0 fL   MCH 30.7 26.0 - 34.0 pg   MCHC 32.4 30.0 - 36.0 g/dL   RDW 19.1 47.8 - 29.5 %   Platelets 247 150 - 400 K/uL   nRBC 0.0 0.0 - 0.2 %   Neutrophils Relative % 62 %   Neutro Abs 4.4 1.7 - 7.7 K/uL   Lymphocytes Relative 25 %   Lymphs Abs 1.7 0.7 - 4.0 K/uL   Monocytes Relative 10 %   Monocytes Absolute 0.7 0.1 - 1.0 K/uL   Eosinophils Relative 2 %   Eosinophils Absolute 0.1 0.0 - 0.5 K/uL   Basophils Relative 1 %    Basophils Absolute 0.1 0.0 - 0.1 K/uL   Immature Granulocytes 0 %   Abs Immature Granulocytes 0.03 0.00 - 0.07 K/uL    Comment: Performed at [facility], [address] [place], [place] [address]    Studies/Results: DG ERCP  Result Date: 12/30/2022 CLINICAL DATA:  Choledocholithiasis EXAM: ERCP TECHNIQUE: Multiple spot images obtained with the fluoroscopic device and submitted for interpretation post-procedure. FLUOROSCOPY: Radiation Exposure Index (as provided by the fluoroscopic device): 348.7 mGy Kerma COMPARISON:  MRCP 12/27/2022 FINDINGS: A total of 8 intraoperative spot images are submitted for review. The images demonstrate a flexible duodenal scope in the descending duodenum with wire cannulation of the common bile duct followed by sphincterotomy and balloon sweeping of the common duct. IMPRESSION: ERCP with sphincterotomy and balloon sweeping of the common duct. These images were submitted for radiologic interpretation only. Please see the procedural report for the amount of contrast and the fluoroscopy time utilized. Electronically Signed   By: Malachy Moan M.D.   On: 12/30/2022 07:31   DG C-Arm 1-60 Min-No Report  Result Date: 12/29/2022 Fluoroscopy was utilized by the requesting physician.  No radiographic interpretation.    Medications: I have reviewed the patient's current medications.  Assessment: Biliary pancreatitis, lipase 1446 on 12/27/2022  Status post ERCP with biliary sphincterotomy and balloon extraction for removal of CBD stones  T. bili 0.6/AST 92/ALT 100/ALP 178 today  Plan: Will advance diet to low-fat. Encourage mobilization, out of bed to chair, move around in the hallways. If patient can tolerate diet, okay to discharge home today from GI standpoint.  Kerin Salen, MD 12/30/2022, 8:10 AM

## 2022-12-30 NOTE — Hospital Course (Signed)
Kathleen Morgan is a 67 y.o. F with HTN, hx cholecystectomy who presented with few days epigastric pain, vomiting.  MRCP showed choledocholithiasis, Tbili 1.6.  Transferred to [facility] for ERCP.  Underwent ERCP 10/18

## 2022-12-31 DIAGNOSIS — K8050 Calculus of bile duct without cholangitis or cholecystitis without obstruction: Secondary | ICD-10-CM | POA: Diagnosis not present

## 2022-12-31 LAB — COMPREHENSIVE METABOLIC PANEL
ALT: 76 U/L — ABNORMAL HIGH (ref 0–44)
AST: 46 U/L — ABNORMAL HIGH (ref 15–41)
Albumin: 3.2 g/dL — ABNORMAL LOW (ref 3.5–5.0)
Alkaline Phosphatase: 138 U/L — ABNORMAL HIGH (ref 38–126)
Anion gap: 9 (ref 5–15)
BUN: 7 mg/dL — ABNORMAL LOW (ref 8–23)
CO2: 26 mmol/L (ref 22–32)
Calcium: 8.9 mg/dL (ref 8.9–10.3)
Chloride: 103 mmol/L (ref 98–111)
Creatinine, Ser: 0.85 mg/dL (ref 0.44–1.00)
GFR, Estimated: >60 mL/min (ref >60)
Glucose, Bld: 94 mg/dL (ref 70–99)
Potassium: 3.5 mmol/L (ref 3.5–5.1)
Sodium: 138 mmol/L (ref 135–145)
Total Bilirubin: 0.7 mg/dL (ref 0.3–1.2)
Total Protein: 7.2 g/dL (ref 6.5–8.1)

## 2022-12-31 LAB — CBC
HCT: 31.5 % — ABNORMAL LOW (ref 36.0–46.0)
Hemoglobin: 10.1 g/dL — ABNORMAL LOW (ref 12.0–15.0)
MCH: 30.1 pg (ref 26.0–34.0)
MCHC: 32.1 g/dL (ref 30.0–36.0)
MCV: 93.8 fL (ref 80.0–100.0)
Platelets: 270 10*3/uL (ref 150–400)
RBC: 3.36 MIL/uL — ABNORMAL LOW (ref 3.87–5.11)
RDW: 13 % (ref 11.5–15.5)
WBC: 5.6 10*3/uL (ref 4.0–10.5)
nRBC: 0 % (ref 0.0–0.2)

## 2022-12-31 LAB — LIPASE, BLOOD: Lipase: 35 U/L (ref 11–51)

## 2022-12-31 NOTE — Progress Notes (Signed)
Discharge instructions given to patient and all questions were answered.  

## 2022-12-31 NOTE — Discharge Summary (Signed)
Physician Discharge Summary   Patient: Kathleen Morgan MRN: [id number] DOB: 1955-09-22  Admit date:     12/27/2022  Discharge date: 12/31/22  Discharge Physician: Alberteen Sam   [facility]P: [facility]     Recommendations at discharge:  Follow-up with PCP in 1 week for choledocholithiasis     Discharge Diagnoses: Principal Problem:   Choledocholithiasis Other discharge diagnoses:   Essential hypertension   Morbid obesity     Hospital Course: Kathleen Morgan is a 67 y.o. F with HTN, hx cholecystectomy who presented with few days epigastric pain, vomiting.  MRCP showed choledocholithiasis, Tbili 1.6.  Transferred to [facility] for ERCP.  Underwent ERCP 10/18      Choledocholithiasis and gallstone pancreatitis The patient was admitted, GI were consulted.  She underwent ERCP with successful sphincterotomy, drainage of the common bile duct.  Postop she was able to advance her diet.  Discharged in good shape.    Hypertension On lisinopril and diltiazem    Morbid obesity BMI 39.4 in setting of comorbid hypertension.             The [facility] Controlled Substances Registry was reviewed for this patient prior to discharge.  Consultants: Gastroenterology, Dr. Ewing Schlein Procedures performed:  ERCP  Disposition: Home Diet recommendation:  Low residue  DISCHARGE MEDICATION: Allergies as of 12/31/2022   No Known Allergies      Medication List     STOP taking these medications    predniSONE 10 MG tablet Commonly known as: DELTASONE       TAKE these medications    Dilt-XR 240 MG 24 hr capsule Generic drug: diltiazem Take 240 mg by mouth in the morning and at bedtime.   famotidine 20 MG tablet Commonly known as: PEPCID Take 20 mg by mouth daily as needed for heartburn.   lisinopril 5 MG tablet Commonly known as: ZESTRIL Take 5 mg by mouth at bedtime.   loratadine 10 MG tablet Commonly known as: CLARITIN Take 10 mg by mouth  daily as needed for allergies or rhinitis.   naproxen 500 MG tablet Commonly known as: Naprosyn Take 1 tablet (500 mg total) by mouth 2 (two) times daily with a meal.   neomycin-polymyxin-dexamethasone 0.1 % ophthalmic suspension Commonly known as: MAXITROL Place 1 drop into the right eye daily as needed (for inflammation).   Pataday 0.7 % Soln Generic drug: Olopatadine HCl Place 1 drop into both eyes 3 (three) times daily as needed (for allergies).         Discharge Instructions     Discharge instructions   Complete by: As directed    **IMPORTANT DISCHARGE INSTRUCTIONS**   From Dr. Maryfrances Bunnell: You were admitted for a blockage of your bile ducts.  You had an ERCP procedure (endoscopy with cleaning out of the bile ducts) by Dr. Ewing Schlein from [facility]  This fixed the problem  No particular follow up is needed  Since the bile duct blockage was blocking the organs responsible for digesting fat and meat, avoid fatty foods and heavy proteins for now  Eat a bland diet: Bananas Rice  Toast Applesauce Crackers Mashed potatoes Stewed vegetables Cereal Vegetarian meat substitutes    Soon, if you are tolerating that well, you can advance to eggs, lean meats, dairy  Avoid fatty foods (fried foods, greasy foods, foods cooked in oil, hot dogs, baloney, etc) until you are feeling 100% back to normal  Go see your primary doctor in 1 week   Increase activity slowly  Complete by: As directed        Discharge Exam: Filed Weights   12/27/22 1603 12/29/22 1223  Weight: 104.1 kg 104.1 kg    General: Pt is alert, awake, not in acute distress Cardiovascular: RRR, nl S1-S2, no murmurs appreciated.   No LE edema.   Respiratory: Normal respiratory rate and rhythm.  CTAB without rales or wheezes. Abdominal: Abdomen soft and non-tender.  No distension or HSM.   Neuro/Psych: Strength symmetric in upper and lower extremities.  Judgment and insight appear  normal.   Condition at discharge: good  The results of significant diagnostics from this hospitalization (including imaging, microbiology, ancillary and laboratory) are listed below for reference.   Imaging Studies: DG ERCP  Result Date: 12/30/2022 CLINICAL DATA:  Choledocholithiasis EXAM: ERCP TECHNIQUE: Multiple spot images obtained with the fluoroscopic device and submitted for interpretation post-procedure. FLUOROSCOPY: Radiation Exposure Index (as provided by the fluoroscopic device): 348.7 mGy Kerma COMPARISON:  MRCP 12/27/2022 FINDINGS: A total of 8 intraoperative spot images are submitted for review. The images demonstrate a flexible duodenal scope in the descending duodenum with wire cannulation of the common bile duct followed by sphincterotomy and balloon sweeping of the common duct. IMPRESSION: ERCP with sphincterotomy and balloon sweeping of the common duct. These images were submitted for radiologic interpretation only. Please see the procedural report for the amount of contrast and the fluoroscopy time utilized. Electronically Signed   By: Malachy Moan M.D.   On: 12/30/2022 07:31   DG C-Arm 1-60 Min-No Report  Result Date: 12/29/2022 Fluoroscopy was utilized by the requesting physician.  No radiographic interpretation.   MR ABDOMEN MRCP W WO CONTAST  Result Date: 12/27/2022 CLINICAL DATA:  Nausea and vomiting. Biliary dilatation on previous CT. EXAM: MRI ABDOMEN WITHOUT AND WITH CONTRAST (INCLUDING MRCP) TECHNIQUE: Multiplanar multisequence MR imaging of the abdomen was performed both before and after the administration of intravenous contrast. Heavily T2-weighted images of the biliary and pancreatic ducts were obtained, and three-dimensional MRCP images were rendered by post processing. CONTRAST:  10mL GADAVIST GADOBUTROL 1 MMOL/ML IV SOLN COMPARISON:  CT scan from earlier the same day. FINDINGS: Lower chest: Dependent atelectasis.  Acute findings Hepatobiliary: No focal  mass lesion evident within the liver. No focal restricted diffusion. Intra and extrahepatic biliary duct dilatation noted with common duct dilated up to 13 mm. Common bile duct in the head of the pancreas measures 11 mm just proximal to the ampulla. 2 stones are seen in the distal common bile duct, the larger measuring approximately 4 mm in the more distal smaller stone measuring about 3 mm (see 3D MRCP image 67 of series 5). Pancreas: There is some atrophy of the pancreatic parenchyma with prominence of the main pancreatic duct in the head of the pancreas measuring up to 5 mm diameter. There is some edema in and around the pancreatic head and the pancreatico duodenal groove with edema noted in the porta hepatis. 2.0 x 1.6 cm nodular focus of confluent soft tissue is identified in the tail of the pancreas with the confluence of the soft tissue at upper portion to the remaining background pancreatic parenchyma. This nodule follows splenic signal characteristics and is similar in size and appearance to a CT scan from 11/20/2006. As such, this is felt to be a splenule, potentially of intrapancreatic location. Spleen: No splenomegaly. No suspicious focal mass lesion. See above. Adrenals/Urinary Tract: No adrenal nodule or mass. Right nephrectomy. Scattered tiny foci of non enhancement are seen in the left kidney,  some of which demonstrate intrinsic T1 shortening on precontrast images. Although these are technically too small to characterize, imaging features are most suggestive of benign Bosniak I and II cysts requiring no specific further imaging follow-up. Stomach/Bowel: Stomach is unremarkable. No gastric wall thickening. No evidence of outlet obstruction. Duodenum is normally positioned as is the ligament of Treitz. There is some Peri duodenal edema. No small bowel or colonic dilatation within the visualized abdomen. Vascular/Lymphatic: No abdominal aortic aneurysm. Portal vein, superior mesenteric vein, and splenic  vein are patent. No abdominal lymphadenopathy Other:  No substantial ascites. Musculoskeletal: No focal suspicious marrow enhancement within the visualized bony anatomy. IMPRESSION: 1. Intra and extrahepatic biliary duct dilatation with 2 stones in the distal common bile duct, the larger measuring 4 mm and the more distal smaller stone measuring 3 mm. 2. Edema in and around the pancreatic head and pancreatico duodenal groove with edema in the porta hepatis. Imaging features may be secondary to the biliary disease although they do raise concern for acute pancreatitis. 3. 2.0 x 1.6 cm nodular focus of confluent soft tissue in the tail of the pancreas follows splenic signal characteristics and is similar in size and appearance to a CT scan from 11/20/2006. As such, this is felt to be a splenule, potentially of intrapancreatic location. 4. Right nephrectomy. Electronically Signed   By: Kennith Center M.D.   On: 12/27/2022 07:03   MR 3D Recon At Scanner  Result Date: 12/27/2022 CLINICAL DATA:  Nausea and vomiting. Biliary dilatation on previous CT. EXAM: MRI ABDOMEN WITHOUT AND WITH CONTRAST (INCLUDING MRCP) TECHNIQUE: Multiplanar multisequence MR imaging of the abdomen was performed both before and after the administration of intravenous contrast. Heavily T2-weighted images of the biliary and pancreatic ducts were obtained, and three-dimensional MRCP images were rendered by post processing. CONTRAST:  10mL GADAVIST GADOBUTROL 1 MMOL/ML IV SOLN COMPARISON:  CT scan from earlier the same day. FINDINGS: Lower chest: Dependent atelectasis.  Acute findings Hepatobiliary: No focal mass lesion evident within the liver. No focal restricted diffusion. Intra and extrahepatic biliary duct dilatation noted with common duct dilated up to 13 mm. Common bile duct in the head of the pancreas measures 11 mm just proximal to the ampulla. 2 stones are seen in the distal common bile duct, the larger measuring approximately 4 mm in the  more distal smaller stone measuring about 3 mm (see 3D MRCP image 67 of series 5). Pancreas: There is some atrophy of the pancreatic parenchyma with prominence of the main pancreatic duct in the head of the pancreas measuring up to 5 mm diameter. There is some edema in and around the pancreatic head and the pancreatico duodenal groove with edema noted in the porta hepatis. 2.0 x 1.6 cm nodular focus of confluent soft tissue is identified in the tail of the pancreas with the confluence of the soft tissue at upper portion to the remaining background pancreatic parenchyma. This nodule follows splenic signal characteristics and is similar in size and appearance to a CT scan from 11/20/2006. As such, this is felt to be a splenule, potentially of intrapancreatic location. Spleen: No splenomegaly. No suspicious focal mass lesion. See above. Adrenals/Urinary Tract: No adrenal nodule or mass. Right nephrectomy. Scattered tiny foci of non enhancement are seen in the left kidney, some of which demonstrate intrinsic T1 shortening on precontrast images. Although these are technically too small to characterize, imaging features are most suggestive of benign Bosniak I and II cysts requiring no specific further imaging follow-up. Stomach/Bowel:  Stomach is unremarkable. No gastric wall thickening. No evidence of outlet obstruction. Duodenum is normally positioned as is the ligament of Treitz. There is some Peri duodenal edema. No small bowel or colonic dilatation within the visualized abdomen. Vascular/Lymphatic: No abdominal aortic aneurysm. Portal vein, superior mesenteric vein, and splenic vein are patent. No abdominal lymphadenopathy Other:  No substantial ascites. Musculoskeletal: No focal suspicious marrow enhancement within the visualized bony anatomy. IMPRESSION: 1. Intra and extrahepatic biliary duct dilatation with 2 stones in the distal common bile duct, the larger measuring 4 mm and the more distal smaller stone  measuring 3 mm. 2. Edema in and around the pancreatic head and pancreatico duodenal groove with edema in the porta hepatis. Imaging features may be secondary to the biliary disease although they do raise concern for acute pancreatitis. 3. 2.0 x 1.6 cm nodular focus of confluent soft tissue in the tail of the pancreas follows splenic signal characteristics and is similar in size and appearance to a CT scan from 11/20/2006. As such, this is felt to be a splenule, potentially of intrapancreatic location. 4. Right nephrectomy. Electronically Signed   By: Kennith Center M.D.   On: 12/27/2022 07:03   CT ABDOMEN PELVIS W CONTRAST  Result Date: 12/27/2022 CLINICAL DATA:  Abdominal pain with nausea and vomiting. EXAM: CT ABDOMEN AND PELVIS WITH CONTRAST TECHNIQUE: Multidetector CT imaging of the abdomen and pelvis was performed using the standard protocol following bolus administration of intravenous contrast. RADIATION DOSE REDUCTION: This exam was performed according to the departmental dose-optimization program which includes automated exposure control, adjustment of the mA and/or kV according to patient size and/or use of iterative reconstruction technique. CONTRAST:  OMNIPAQUE IOHEXOL 350 MG/ML SOLN COMPARISON:  October 24, 2022 FINDINGS: Lower chest: No acute abnormality. Hepatobiliary: No focal liver abnormality is seen. Status post cholecystectomy. Moderate to marked severity intrahepatic and extrahepatic biliary dilatation is seen. The common bile duct measures 13 mm in diameter. Pancreas: Unremarkable. No pancreatic ductal dilatation or surrounding inflammatory changes. Spleen: Normal in size without focal abnormality. Adrenals/Urinary Tract: Adrenal glands are unremarkable. The right kidney is surgically absent. The left kidney is normal, without renal calculi, focal lesion, or hydronephrosis. Bladder is unremarkable. Stomach/Bowel: Stomach is within normal limits. Appendix appears normal. No evidence of  bowel wall thickening, distention, or inflammatory changes. Noninflamed diverticula are seen scattered throughout the large bowel. Vascular/Lymphatic: Aortic atherosclerosis. No enlarged abdominal or pelvic lymph nodes. Reproductive: Multiple calcified uterine fibroids are seen. The bilateral adnexa are unremarkable. Other: No abdominal wall hernia or abnormality. No abdominopelvic ascites. Musculoskeletal: Multilevel degenerative changes seen throughout the lumbar spine with postoperative changes noted at the level of L5. IMPRESSION: 1. Moderate to marked severity intrahepatic and extrahepatic biliary dilatation, likely secondary to prior cholecystectomy. 2. Evidence of prior right nephrectomy. 3. Colonic diverticulosis. 4. Multiple calcified uterine fibroids. 5. Aortic atherosclerosis. Aortic Atherosclerosis (ICD10-I70.0). Electronically Signed   By: Aram Candela M.D.   On: 12/27/2022 03:35    Microbiology: Results for orders placed or performed during the hospital encounter of 12/27/22  Urine Culture     Status: None   Collection Time: 12/27/22  2:35 AM   Specimen: Urine, Clean Catch  Result Value Ref Range Status   Specimen Description   Final    URINE, CLEAN CATCH Performed at [facility], [address] [place], [place] [address]    Special Requests   Final    NONE Performed at [facility], [address] [place], [place]  [address]    Culture   Final    NO GROWTH Performed at [facility], [address], [place], [place] [address]    Report Status 12/28/2022 FINAL  Final    Labs: CBC: Recent Labs  Lab 12/27/22 0115 12/28/22 0719 12/29/22 0421 12/30/22 0506 12/31/22 0450  WBC 13.9* 8.3 7.5 7.1 5.6  NEUTROABS  --   --   --  4.4  --   HGB 11.8* 10.4* 10.1* 9.7* 10.1*  HCT 36.5 33.7* 31.3* 29.9* 31.5*  MCV 91.9 96.8 94.3 94.6 93.8  PLT 297 274 260 247 270   Basic Metabolic Panel: Recent Labs  Lab 12/27/22 0115 12/28/22 0719  12/29/22 0421 12/30/22 0506 12/31/22 0450  NA 135 135 133* 137 138  K 3.9 3.6 3.7 3.8 3.5  CL 101 104 101 103 103  CO2 24 21* 23 26 26   GLUCOSE 158* 82 134* 91 94  BUN 19 13 13 10  7*  CREATININE 1.18* 0.85 0.99 1.21* 0.85  CALCIUM 9.1 8.5* 8.5* 8.7* 8.9   Liver Function Tests: Recent Labs  Lab 12/27/22 0115 12/28/22 0719 12/29/22 0421 12/30/22 0506 12/31/22 0450  AST 184* 65* 36 92* 46*  ALT 165* 115* 81* 100* 76*  ALKPHOS 143* 108 101 178* 138*  BILITOT 1.6* 0.8 0.5 0.6 0.7  PROT 8.8* 7.4 7.3 6.7 7.2  ALBUMIN 3.9 3.3* 3.3* 2.9* 3.2*   CBG: No results for input(s): "GLUCAP" in the last 168 hours.  Discharge time spent: approximately 35 minutes spent on discharge counseling, evaluation of patient on day of discharge, and coordination of discharge planning with nursing, social work, pharmacy and case management  Signed: Alberteen Sam, MD [facility] 12/31/2022

## 2022-12-31 NOTE — Plan of Care (Signed)
  Problem: Clinical Measurements: Goal: Will remain free from infection Outcome: Progressing Goal: Diagnostic test results will improve Outcome: Progressing   

## 2023-01-01 ENCOUNTER — Encounter (HOSPITAL_COMMUNITY): Payer: Self-pay | Admitting: Gastroenterology

## 2023-01-05 DIAGNOSIS — K8050 Calculus of bile duct without cholangitis or cholecystitis without obstruction: Secondary | ICD-10-CM | POA: Diagnosis not present

## 2023-01-05 DIAGNOSIS — Z23 Encounter for immunization: Secondary | ICD-10-CM | POA: Diagnosis not present

## 2023-01-05 DIAGNOSIS — Z0131 Encounter for examination of blood pressure with abnormal findings: Secondary | ICD-10-CM | POA: Diagnosis not present

## 2023-01-05 DIAGNOSIS — Z0130 Encounter for examination of blood pressure without abnormal findings: Secondary | ICD-10-CM | POA: Diagnosis not present

## 2023-01-05 DIAGNOSIS — Z1389 Encounter for screening for other disorder: Secondary | ICD-10-CM | POA: Diagnosis not present

## 2023-01-11 ENCOUNTER — Encounter (HOSPITAL_COMMUNITY): Payer: Self-pay | Admitting: Gastroenterology

## 2023-01-11 NOTE — Transfer of Care (Signed)
Immediate Anesthesia Transfer of Care Note  Patient: Kathleen Morgan  Procedure(s) Performed: ENDOSCOPIC RETROGRADE CHOLANGIOPANCREATOGRAPHY (ERCP) SPHINCTEROTOMY REMOVAL OF STONES BALLOON DILATION  Patient Location: PACU  Anesthesia Type:General  Level of Consciousness: drowsy and patient cooperative  Airway & Oxygen Therapy: Patient connected to face mask oxygen  Post-op Assessment: Report given to RN and Post -op Vital signs reviewed and stable  Post vital signs: stable  Last Vitals:  Vitals Value Taken Time  BP 153/74 12/29/22 1600  Temp 36.4 C 12/29/22 1528  Pulse 78 12/29/22 1600  Resp 17 12/29/22 1600  SpO2 93 % 12/29/22 1600    Last Pain:  Vitals:   12/31/22 0752  TempSrc:   PainSc: 0-No pain      Patients Stated Pain Goal: 10 (12/29/22 1813)  Complications: No notable events documented.

## 2023-01-15 NOTE — Anesthesia Postprocedure Evaluation (Signed)
Anesthesia Post Note  Patient: Kathleen Morgan  Procedure(s) Performed: ENDOSCOPIC RETROGRADE CHOLANGIOPANCREATOGRAPHY (ERCP) SPHINCTEROTOMY REMOVAL OF STONES BALLOON DILATION     Patient location during evaluation: PACU Anesthesia Type: General Level of consciousness: awake and alert Pain management: pain level controlled Vital Signs Assessment: post-procedure vital signs reviewed and stable Respiratory status: spontaneous breathing, nonlabored ventilation and respiratory function stable Cardiovascular status: blood pressure returned to baseline and stable Postop Assessment: no apparent nausea or vomiting Anesthetic complications: no   No notable events documented.  Last Vitals:  Vitals:   12/30/22 2120 12/31/22 0529  BP: (!) 150/86 (!) 154/74  Pulse: 78 70  Resp: 18 17  Temp: 36.7 C 36.8 C  SpO2: 95% 98%    Last Pain:  Vitals:   12/31/22 0752  TempSrc:   PainSc: 0-No pain   Pain Goal: Patients Stated Pain Goal: 10 (12/29/22 1813)                 Lowella Curb

## 2023-04-17 DIAGNOSIS — I10 Essential (primary) hypertension: Secondary | ICD-10-CM | POA: Diagnosis not present

## 2023-04-17 DIAGNOSIS — Z5919 Other inadequate housing: Secondary | ICD-10-CM | POA: Diagnosis not present

## 2023-04-17 DIAGNOSIS — Z87891 Personal history of nicotine dependence: Secondary | ICD-10-CM | POA: Diagnosis not present

## 2023-04-17 DIAGNOSIS — Z818 Family history of other mental and behavioral disorders: Secondary | ICD-10-CM | POA: Diagnosis not present

## 2023-04-17 DIAGNOSIS — Z809 Family history of malignant neoplasm, unspecified: Secondary | ICD-10-CM | POA: Diagnosis not present

## 2023-04-17 DIAGNOSIS — Z91199 Patient's noncompliance with other medical treatment and regimen due to unspecified reason: Secondary | ICD-10-CM | POA: Diagnosis not present

## 2023-04-17 DIAGNOSIS — R011 Cardiac murmur, unspecified: Secondary | ICD-10-CM | POA: Diagnosis not present

## 2023-04-17 DIAGNOSIS — Z8249 Family history of ischemic heart disease and other diseases of the circulatory system: Secondary | ICD-10-CM | POA: Diagnosis not present

## 2023-04-17 DIAGNOSIS — M1990 Unspecified osteoarthritis, unspecified site: Secondary | ICD-10-CM | POA: Diagnosis not present

## 2023-04-17 DIAGNOSIS — Z6837 Body mass index (BMI) 37.0-37.9, adult: Secondary | ICD-10-CM | POA: Diagnosis not present

## 2023-04-17 DIAGNOSIS — H269 Unspecified cataract: Secondary | ICD-10-CM | POA: Diagnosis not present

## 2024-01-24 ENCOUNTER — Ambulatory Visit: Attending: Cardiology | Admitting: Cardiology

## 2024-01-24 ENCOUNTER — Encounter: Payer: Self-pay | Admitting: Cardiology

## 2024-01-24 VITALS — BP 139/79 | HR 69 | Ht 64.0 in | Wt 219.8 lb

## 2024-01-24 DIAGNOSIS — I77810 Thoracic aortic ectasia: Secondary | ICD-10-CM

## 2024-01-24 DIAGNOSIS — I10 Essential (primary) hypertension: Secondary | ICD-10-CM

## 2024-01-24 MED ORDER — LISINOPRIL 10 MG PO TABS: 10.0000 mg | ORAL_TABLET | Freq: Every day | ORAL | 3 refills | Status: AC

## 2024-01-24 NOTE — Patient Instructions (Signed)
 Medication Instructions:  Your physician recommends that you continue on your current medications as directed. Please refer to the Current Medication list given to you today.   *If you need a refill on your cardiac medications before your next appointment, please call your pharmacy*  Lab Work: No labs ordered today  If you have labs (blood work) drawn today and your tests are completely normal, you will receive your results only by: MyChart Message (if you have MyChart) OR A paper copy in the mail If you have any lab test that is abnormal or we need to change your treatment, we will call you to review the results.  Testing/Procedures: Your physician has requested that you have an echocardiogram. Echocardiography is a painless test that uses sound waves to create images of your heart. It provides your doctor with information about the size and shape of your heart and how well your heart's chambers and valves are working.   You may receive an ultrasound enhancing agent through an IV if needed to better visualize your heart during the echo. This procedure takes approximately one hour.  There are no restrictions for this procedure.  This will take place at [address] [facility] [address], [place] [address]  Please note: We ask at that you not bring children with you during ultrasound (echo/ vascular) testing. Due to room size and safety concerns, children are not allowed in the ultrasound rooms during exams. Our front office staff cannot provide observation of children in our lobby area while testing is being conducted. An adult accompanying a patient to their appointment will only be allowed in the ultrasound room at the discretion of the ultrasound technician under special circumstances. We apologize for any inconvenience.   Follow-Up: At [facility], you and your health needs are our priority.  As part of our continuing mission to provide you with exceptional heart  care, our providers are all part of one team.  This team includes your primary Cardiologist (physician) and Advanced Practice Providers or APPs (Physician Assistants and Nurse Practitioners) who all work together to provide you with the care you need, when you need it.  Your next appointment:   3 month(s)  Provider:   Redell Cave, MD    We recommend signing up for the patient portal called MyChart.  Sign up information is provided on this After Visit Summary.  MyChart is used to connect with patients for Virtual Visits (Telemedicine).  Patients are able to view lab/test results, encounter notes, upcoming appointments, etc.  Non-urgent messages can be sent to your provider as well.   To learn more about what you can do with MyChart, go to [email or web address].

## 2024-01-24 NOTE — Progress Notes (Signed)
 Cardiology Office Note:    Date:  01/24/2024   ID:  Kathleen Morgan, DOB [birth year], MRN [id number]  PCP:  [facility]   [facility] Providers Cardiologist:  Redell Cave, MD     Referring MD: [facility]   Chief Complaint  Patient presents with   Follow-up    12 month follow up visit. Patient is doing well on today. Meds reviewed.     History of Present Illness:    Kathleen Morgan is a 68 y.o. female with a hx of hypertension, moderately dilated ascending aorta who presents for follow-up.  Last seen due to dilated ascending aorta, was referred to CT surgery, has not had an appointment yet.  Chest CT showed ascending aorta measuring 4.5 cm, echocardiogram was measured at 4.8.  Denies chest pain or shortness of breath.  Feels well, no concerns at this time.  Prior notes/studies Echo 08/2022 - EF 60 to 65%,  mild aortic sclerosis, moderate ascending aorta dilatation 4.8 cm in diameter.   Past Medical History:  Diagnosis Date   Arthritis    Carpal tunnel syndrome    Heart palpitations    Hypertension     Past Surgical History:  Procedure Laterality Date   BACK SURGERY     BALLOON DILATION N/A 12/29/2022   Procedure: BALLOON DILATION;  Surgeon: Rosalie Kitchens, MD;  Location: [facility];  Service: Gastroenterology;  Laterality: N/A;   CESAREAN SECTION     ERCP N/A 12/29/2022   Procedure: ENDOSCOPIC RETROGRADE CHOLANGIOPANCREATOGRAPHY (ERCP);  Surgeon: Rosalie Kitchens, MD;  Location: [facility];  Service: Gastroenterology;  Laterality: N/A;   HERNIA REPAIR     NEPHRECTOMY     REMOVAL OF STONES  12/29/2022   Procedure: REMOVAL OF STONES;  Surgeon: Rosalie Kitchens, MD;  Location: [facility];  Service: Gastroenterology;;   ANNETT  12/29/2022   Procedure: ANNETT;  Surgeon: Rosalie Kitchens, MD;  Location: [facility];  Service: Gastroenterology;;    Current Medications: Current Meds  Medication Sig   DILT-XR 240 MG  24 hr capsule Take 240 mg by mouth in the morning and at bedtime.   lisinopril  (ZESTRIL ) 10 MG tablet Take 1 tablet (10 mg total) by mouth daily.   loratadine (CLARITIN) 10 MG tablet Take 10 mg by mouth daily as needed for allergies or rhinitis.   neomycin-polymyxin-dexamethasone  (MAXITROL) 0.1 % ophthalmic suspension Place 1 drop into the right eye daily as needed (for inflammation).   PATADAY  0.7 % SOLN Place 1 drop into both eyes 3 (three) times daily as needed (for allergies).   [DISCONTINUED] lisinopril  (ZESTRIL ) 5 MG tablet Take 5 mg by mouth at bedtime.     Allergies:   Patient has no known allergies.   Social History   Socioeconomic History   Marital status: Divorced    Spouse name: Not on file   Number of children: Not on file   Years of education: Not on file   Highest education level: Not on file  Occupational History   Not on file  Tobacco Use   Smoking status: Never   Smokeless tobacco: Never  Vaping Use   Vaping status: Never Used  Substance and Sexual Activity   Alcohol use: No    Alcohol/week: 0.0 standard drinks of alcohol   Drug use: No   Sexual activity: Not on file  Other Topics Concern   Not on file  Social History Narrative   Not on file   Social Drivers of Health  Financial Resource Strain: Not on file  Food Insecurity: No Food Insecurity (12/27/2022)   Hunger Vital Sign    Worried About Running Out of Food in the Last Year: Never true    Ran Out of Food in the Last Year: Never true  Transportation Needs: Unmet Transportation Needs (12/27/2022)   PRAPARE - Administrator, Civil Service (Medical): Yes    Lack of Transportation (Non-Medical): No  Physical Activity: Not on file  Stress: Not on file  Social Connections: Not on file     Family History: The patient's family history includes Congestive Heart Failure in her daughter and mother. There is no history of Breast cancer.  ROS:   Please see the history of present illness.      All other systems reviewed and are negative.  EKGs/Labs/Other Studies Reviewed:    The following studies were reviewed today:   EKG Interpretation Date/Time:  Thursday January 24 2024 11:54:30 EST Ventricular Rate:  69 PR Interval:  158 QRS Duration:  80 QT Interval:  382 QTC Calculation: 409 R Axis:   -24  Text Interpretation: Normal sinus rhythm Minimal voltage criteria for LVH, may be normal variant ( R in aVL ) Confirmed by Darliss Rogue ([id number]) on 01/24/2024 12:02:49 PM    Recent Labs: No results found for requested labs within last 365 days.  Recent Lipid Panel No results found for: CHOL, TRIG, HDL, CHOLHDL, VLDL, LDLCALC, LDLDIRECT   Risk Assessment/Calculations:     Physical Exam:    VS:  BP 139/79   Pulse 69   Ht 5' 4 (1.626 m)   Wt 219 lb 12.8 oz (99.7 kg)   SpO2 95%   BMI 37.73 kg/m     Wt Readings from Last 3 Encounters:  01/24/24 219 lb 12.8 oz (99.7 kg)  12/29/22 229 lb 8 oz (104.1 kg)  12/27/22 223 lb (101.2 kg)     GEN:  Well nourished, well developed in no acute distress HEENT: Normal NECK: No JVD; No carotid bruits CARDIAC: RRR, 2/6 systolic murmur RESPIRATORY:  Clear to auscultation without rales, wheezing or rhonchi  ABDOMEN: Soft, non-tender, non-distended MUSCULOSKELETAL:  No edema; No deformity  SKIN: Warm and dry NEUROLOGIC:  Alert and oriented x 3 PSYCHIATRIC:  Normal affect   ASSESSMENT:    1. Ascending aorta dilation   2. Primary hypertension    PLAN:    In order of problems listed above:  Moderate dilatation of ascending aorta measuring 4.8 cm on echo 6/24, 4.5 on chest CT 8/24. previously referred to CT surgery.  Will place another referral to CT surgery.  Repeat echocardiogram. Hypertension, BP not adequately controlled.  Increase lisinopril  to 10 mg daily, continue Cardizem  240 mg daily.    Follow-up after echocardiogram.     Medication Adjustments/Labs and Tests Ordered: Current medicines  are reviewed at length with the patient today.  Concerns regarding medicines are outlined above.  Orders Placed This Encounter  Procedures   Ambulatory referral to Cardiothoracic Surgery   EKG 12-Lead   ECHOCARDIOGRAM COMPLETE   Meds ordered this encounter  Medications   lisinopril  (ZESTRIL ) 10 MG tablet    Sig: Take 1 tablet (10 mg total) by mouth daily.    Dispense:  90 tablet    Refill:  3    Patient Instructions  Medication Instructions:  Your physician recommends that you continue on your current medications as directed. Please refer to the Current Medication list given to you today.   *If you  need a refill on your cardiac medications before your next appointment, please call your pharmacy*  Lab Work: No labs ordered today  If you have labs (blood work) drawn today and your tests are completely normal, you will receive your results only by: MyChart Message (if you have MyChart) OR A paper copy in the mail If you have any lab test that is abnormal or we need to change your treatment, we will call you to review the results.  Testing/Procedures: Your physician has requested that you have an echocardiogram. Echocardiography is a painless test that uses sound waves to create images of your heart. It provides your doctor with information about the size and shape of your heart and how well your heart's chambers and valves are working.   You may receive an ultrasound enhancing agent through an IV if needed to better visualize your heart during the echo. This procedure takes approximately one hour.  There are no restrictions for this procedure.  This will take place at [address] [facility] [address], [place] [address]  Please note: We ask at that you not bring children with you during ultrasound (echo/ vascular) testing. Due to room size and safety concerns, children are not allowed in the ultrasound rooms during exams. Our front office staff cannot provide  observation of children in our lobby area while testing is being conducted. An adult accompanying a patient to their appointment will only be allowed in the ultrasound room at the discretion of the ultrasound technician under special circumstances. We apologize for any inconvenience.   Follow-Up: At [facility], you and your health needs are our priority.  As part of our continuing mission to provide you with exceptional heart care, our providers are all part of one team.  This team includes your primary Cardiologist (physician) and Advanced Practice Providers or APPs (Physician Assistants and Nurse Practitioners) who all work together to provide you with the care you need, when you need it.  Your next appointment:   3 month(s)  Provider:   Redell Cave, MD    We recommend signing up for the patient portal called MyChart.  Sign up information is provided on this After Visit Summary.  MyChart is used to connect with patients for Virtual Visits (Telemedicine).  Patients are able to view lab/test results, encounter notes, upcoming appointments, etc.  Non-urgent messages can be sent to your provider as well.   To learn more about what you can do with MyChart, go to [email or web address].         Signed, Redell Cave, MD  01/24/2024 12:38 PM    [facility]

## 2024-01-30 ENCOUNTER — Other Ambulatory Visit: Payer: Self-pay | Admitting: Physician Assistant

## 2024-01-30 DIAGNOSIS — Z1231 Encounter for screening mammogram for malignant neoplasm of breast: Secondary | ICD-10-CM

## 2024-02-06 ENCOUNTER — Ambulatory Visit
Admission: RE | Admit: 2024-02-06 | Discharge: 2024-02-06 | Disposition: A | Discharge status: Home / Self Care | Source: Ambulatory Visit | Attending: Cardiology | Admitting: Cardiology

## 2024-02-06 DIAGNOSIS — I77810 Thoracic aortic ectasia: Secondary | ICD-10-CM | POA: Diagnosis present

## 2024-02-06 MED ORDER — IOHEXOL 350 MG/ML SOLN
100.0000 mL | Freq: Once | INTRAVENOUS | Status: AC | PRN
  Administered 2024-02-06: 100 mL via INTRAVENOUS

## 2024-02-18 ENCOUNTER — Ambulatory Visit: Attending: Cardiology

## 2024-02-18 ENCOUNTER — Ambulatory Visit: Payer: Self-pay | Admitting: Cardiology

## 2024-02-18 DIAGNOSIS — I77810 Thoracic aortic ectasia: Secondary | ICD-10-CM

## 2024-02-18 LAB — ECHOCARDIOGRAM COMPLETE
AR max vel: 2.9 cm2
AV Area VTI: 2.83 cm2
AV Area mean vel: 3.04 cm2
AV Mean grad: 5 mmHg
AV Peak grad: 9.5 mmHg
Ao pk vel: 1.54 m/s
Area-P 1/2: 4.21 cm2
S' Lateral: 3.1 cm

## 2024-02-21 ENCOUNTER — Encounter

## 2024-04-28 ENCOUNTER — Ambulatory Visit: Admitting: Cardiology
